# Patient Record
Sex: Male | Born: 1960 | Race: White | Hispanic: No | Marital: Married | Smoking: Former smoker
Health system: Southern US, Community
[De-identification: ages and names within clinical notes are randomized; demographics above are authoritative.]

## PROBLEM LIST (undated history)

## (undated) DIAGNOSIS — K449 Diaphragmatic hernia without obstruction or gangrene: Secondary | ICD-10-CM

## (undated) DIAGNOSIS — Z87442 Personal history of urinary calculi: Secondary | ICD-10-CM

## (undated) DIAGNOSIS — K2210 Ulcer of esophagus without bleeding: Secondary | ICD-10-CM

## (undated) DIAGNOSIS — K589 Irritable bowel syndrome without diarrhea: Secondary | ICD-10-CM

## (undated) DIAGNOSIS — M539 Dorsopathy, unspecified: Secondary | ICD-10-CM

## (undated) DIAGNOSIS — K5792 Diverticulitis of intestine, part unspecified, without perforation or abscess without bleeding: Secondary | ICD-10-CM

## (undated) DIAGNOSIS — Z9889 Other specified postprocedural states: Secondary | ICD-10-CM

## (undated) DIAGNOSIS — I10 Essential (primary) hypertension: Secondary | ICD-10-CM

## (undated) DIAGNOSIS — R112 Nausea with vomiting, unspecified: Secondary | ICD-10-CM

## (undated) DIAGNOSIS — K5790 Diverticulosis of intestine, part unspecified, without perforation or abscess without bleeding: Secondary | ICD-10-CM

## (undated) DIAGNOSIS — E785 Hyperlipidemia, unspecified: Secondary | ICD-10-CM

## (undated) DIAGNOSIS — K219 Gastro-esophageal reflux disease without esophagitis: Secondary | ICD-10-CM

## (undated) HISTORY — PX: ANKLE RECONSTRUCTION: SHX1151

## (undated) HISTORY — PX: BACK SURGERY: SHX140

## (undated) HISTORY — PX: TONSILLECTOMY: SUR1361

## (undated) HISTORY — DX: Diverticulitis of intestine, part unspecified, without perforation or abscess without bleeding: K57.92

## (undated) HISTORY — DX: Diverticulosis of intestine, part unspecified, without perforation or abscess without bleeding: K57.90

## (undated) HISTORY — DX: Hyperlipidemia, unspecified: E78.5

## (undated) HISTORY — DX: Ulcer of esophagus without bleeding: K22.10

## (undated) HISTORY — DX: Irritable bowel syndrome, unspecified: K58.9

## (undated) HISTORY — PX: NECK SURGERY: SHX720

## (undated) HISTORY — PX: HAND SURGERY: SHX662

## (undated) HISTORY — DX: Dorsopathy, unspecified: M53.9

## (undated) HISTORY — PX: COLON SURGERY: SHX602

## (undated) HISTORY — DX: Diaphragmatic hernia without obstruction or gangrene: K44.9

## (undated) HISTORY — DX: Gastro-esophageal reflux disease without esophagitis: K21.9

## (undated) HISTORY — PX: OTHER SURGICAL HISTORY: SHX169

## (undated) HISTORY — PX: APPENDECTOMY: SHX54

---

## 1998-07-18 ENCOUNTER — Encounter: Payer: Self-pay | Admitting: Emergency Medicine

## 1998-07-18 ENCOUNTER — Emergency Department (HOSPITAL_COMMUNITY): Admission: EM | Admit: 1998-07-18 | Discharge: 1998-07-18 | Payer: Self-pay | Admitting: Emergency Medicine

## 2002-10-04 ENCOUNTER — Encounter: Payer: Self-pay | Admitting: Emergency Medicine

## 2002-10-04 ENCOUNTER — Emergency Department (HOSPITAL_COMMUNITY): Admission: EM | Admit: 2002-10-04 | Discharge: 2002-10-04 | Payer: Self-pay | Admitting: Emergency Medicine

## 2002-10-11 ENCOUNTER — Inpatient Hospital Stay (HOSPITAL_COMMUNITY): Admission: EM | Admit: 2002-10-11 | Discharge: 2002-10-13 | Payer: Self-pay | Admitting: Emergency Medicine

## 2002-10-11 ENCOUNTER — Encounter: Payer: Self-pay | Admitting: Emergency Medicine

## 2002-11-09 ENCOUNTER — Encounter: Payer: Self-pay | Admitting: General Surgery

## 2002-11-14 ENCOUNTER — Inpatient Hospital Stay (HOSPITAL_COMMUNITY): Admission: RE | Admit: 2002-11-14 | Discharge: 2002-11-17 | Payer: Self-pay | Admitting: General Surgery

## 2002-11-14 ENCOUNTER — Encounter (INDEPENDENT_AMBULATORY_CARE_PROVIDER_SITE_OTHER): Payer: Self-pay

## 2002-11-17 ENCOUNTER — Encounter: Payer: Self-pay | Admitting: Gastroenterology

## 2004-10-15 ENCOUNTER — Ambulatory Visit: Payer: Self-pay | Admitting: Gastroenterology

## 2004-10-20 ENCOUNTER — Ambulatory Visit: Payer: Self-pay | Admitting: Gastroenterology

## 2004-11-25 ENCOUNTER — Ambulatory Visit: Payer: Self-pay | Admitting: Gastroenterology

## 2005-02-16 ENCOUNTER — Ambulatory Visit: Payer: Self-pay | Admitting: Family Medicine

## 2005-02-23 ENCOUNTER — Ambulatory Visit: Payer: Self-pay | Admitting: Family Medicine

## 2005-03-24 ENCOUNTER — Ambulatory Visit: Payer: Self-pay | Admitting: Family Medicine

## 2005-06-26 ENCOUNTER — Ambulatory Visit (HOSPITAL_COMMUNITY): Admission: RE | Admit: 2005-06-26 | Discharge: 2005-06-27 | Payer: Self-pay | Admitting: Neurosurgery

## 2005-09-14 ENCOUNTER — Encounter: Admission: RE | Admit: 2005-09-14 | Discharge: 2005-11-25 | Payer: Self-pay | Admitting: Neurosurgery

## 2006-01-17 ENCOUNTER — Emergency Department (HOSPITAL_COMMUNITY): Admission: EM | Admit: 2006-01-17 | Discharge: 2006-01-17 | Payer: Self-pay | Admitting: Emergency Medicine

## 2006-01-26 ENCOUNTER — Ambulatory Visit (HOSPITAL_BASED_OUTPATIENT_CLINIC_OR_DEPARTMENT_OTHER): Admission: RE | Admit: 2006-01-26 | Discharge: 2006-01-26 | Payer: Self-pay | Admitting: Urology

## 2006-01-27 ENCOUNTER — Emergency Department (HOSPITAL_COMMUNITY): Admission: EM | Admit: 2006-01-27 | Discharge: 2006-01-28 | Payer: Self-pay | Admitting: Emergency Medicine

## 2007-02-15 ENCOUNTER — Encounter: Payer: Self-pay | Admitting: Gastroenterology

## 2007-02-21 ENCOUNTER — Encounter: Admission: RE | Admit: 2007-02-21 | Discharge: 2007-02-21 | Payer: Self-pay | Admitting: General Surgery

## 2007-02-22 ENCOUNTER — Encounter: Payer: Self-pay | Admitting: Gastroenterology

## 2007-03-14 ENCOUNTER — Ambulatory Visit: Payer: Self-pay | Admitting: Gastroenterology

## 2007-09-15 DIAGNOSIS — K5730 Diverticulosis of large intestine without perforation or abscess without bleeding: Secondary | ICD-10-CM | POA: Insufficient documentation

## 2007-09-15 DIAGNOSIS — K219 Gastro-esophageal reflux disease without esophagitis: Secondary | ICD-10-CM | POA: Insufficient documentation

## 2007-09-15 DIAGNOSIS — K648 Other hemorrhoids: Secondary | ICD-10-CM | POA: Insufficient documentation

## 2008-02-07 ENCOUNTER — Ambulatory Visit (HOSPITAL_BASED_OUTPATIENT_CLINIC_OR_DEPARTMENT_OTHER): Admission: RE | Admit: 2008-02-07 | Discharge: 2008-02-07 | Payer: Self-pay | Admitting: Internal Medicine

## 2008-03-16 DIAGNOSIS — M5137 Other intervertebral disc degeneration, lumbosacral region: Secondary | ICD-10-CM | POA: Insufficient documentation

## 2008-03-16 DIAGNOSIS — E785 Hyperlipidemia, unspecified: Secondary | ICD-10-CM | POA: Insufficient documentation

## 2008-03-16 DIAGNOSIS — K279 Peptic ulcer, site unspecified, unspecified as acute or chronic, without hemorrhage or perforation: Secondary | ICD-10-CM | POA: Insufficient documentation

## 2008-03-20 ENCOUNTER — Ambulatory Visit: Payer: Self-pay | Admitting: Gastroenterology

## 2008-03-20 DIAGNOSIS — R1012 Left upper quadrant pain: Secondary | ICD-10-CM | POA: Insufficient documentation

## 2008-03-20 DIAGNOSIS — K921 Melena: Secondary | ICD-10-CM

## 2008-03-20 DIAGNOSIS — K589 Irritable bowel syndrome without diarrhea: Secondary | ICD-10-CM | POA: Insufficient documentation

## 2008-03-27 ENCOUNTER — Telehealth: Payer: Self-pay | Admitting: Gastroenterology

## 2008-03-28 ENCOUNTER — Ambulatory Visit: Payer: Self-pay | Admitting: Gastroenterology

## 2009-12-21 ENCOUNTER — Ambulatory Visit: Payer: Self-pay | Admitting: Diagnostic Radiology

## 2009-12-21 ENCOUNTER — Emergency Department (HOSPITAL_BASED_OUTPATIENT_CLINIC_OR_DEPARTMENT_OTHER): Admission: EM | Admit: 2009-12-21 | Discharge: 2009-12-21 | Payer: Self-pay | Admitting: Emergency Medicine

## 2010-03-14 ENCOUNTER — Encounter: Admission: RE | Admit: 2010-03-14 | Discharge: 2010-03-14 | Payer: Self-pay | Admitting: Neurosurgery

## 2010-06-11 ENCOUNTER — Inpatient Hospital Stay (HOSPITAL_COMMUNITY)
Admission: RE | Admit: 2010-06-11 | Discharge: 2010-06-17 | Payer: Self-pay | Source: Home / Self Care | Attending: Neurosurgery | Admitting: Neurosurgery

## 2010-06-17 ENCOUNTER — Emergency Department (HOSPITAL_BASED_OUTPATIENT_CLINIC_OR_DEPARTMENT_OTHER)
Admission: EM | Admit: 2010-06-17 | Discharge: 2010-06-17 | Payer: Self-pay | Source: Home / Self Care | Admitting: Emergency Medicine

## 2010-07-20 ENCOUNTER — Encounter: Payer: Self-pay | Admitting: General Surgery

## 2010-08-19 ENCOUNTER — Other Ambulatory Visit: Payer: Self-pay | Admitting: Neurosurgery

## 2010-08-19 DIAGNOSIS — M549 Dorsalgia, unspecified: Secondary | ICD-10-CM

## 2010-08-19 DIAGNOSIS — M5410 Radiculopathy, site unspecified: Secondary | ICD-10-CM

## 2010-08-20 ENCOUNTER — Other Ambulatory Visit: Payer: Self-pay | Admitting: Neurosurgery

## 2010-08-20 ENCOUNTER — Ambulatory Visit
Admission: RE | Admit: 2010-08-20 | Discharge: 2010-08-20 | Disposition: A | Discharge status: Home / Self Care | Payer: BC Managed Care – PPO | Source: Ambulatory Visit | Attending: Neurosurgery | Admitting: Neurosurgery

## 2010-08-20 DIAGNOSIS — M549 Dorsalgia, unspecified: Secondary | ICD-10-CM

## 2010-08-20 DIAGNOSIS — M5410 Radiculopathy, site unspecified: Secondary | ICD-10-CM

## 2010-09-03 ENCOUNTER — Ambulatory Visit
Admission: RE | Admit: 2010-09-03 | Discharge: 2010-09-03 | Disposition: A | Discharge status: Home / Self Care | Payer: BC Managed Care – PPO | Source: Ambulatory Visit | Attending: Neurosurgery | Admitting: Neurosurgery

## 2010-09-03 DIAGNOSIS — M549 Dorsalgia, unspecified: Secondary | ICD-10-CM

## 2010-09-03 DIAGNOSIS — M5410 Radiculopathy, site unspecified: Secondary | ICD-10-CM

## 2010-09-08 LAB — SURGICAL PCR SCREEN
MRSA, PCR: NEGATIVE
Staphylococcus aureus: NEGATIVE

## 2010-09-08 LAB — URINALYSIS, ROUTINE W REFLEX MICROSCOPIC
Hgb urine dipstick: NEGATIVE
Specific Gravity, Urine: 1.011 (ref 1.005–1.030)
Urobilinogen, UA: 0.2 mg/dL (ref 0.0–1.0)
pH: 7.5 (ref 5.0–8.0)

## 2010-09-08 LAB — CBC
Hemoglobin: 18.1 g/dL — ABNORMAL HIGH (ref 13.0–17.0)
MCH: 34 pg (ref 26.0–34.0)
RBC: 5.33 MIL/uL (ref 4.22–5.81)

## 2010-09-08 LAB — URINE CULTURE: Culture: NO GROWTH

## 2010-09-08 LAB — BASIC METABOLIC PANEL
CO2: 31 mEq/L (ref 19–32)
Calcium: 10.1 mg/dL (ref 8.4–10.5)
GFR calc Af Amer: >60 mL/min (ref >60)
GFR calc non Af Amer: >60 mL/min (ref >60)
Sodium: 138 mEq/L (ref 135–145)

## 2010-09-14 LAB — BASIC METABOLIC PANEL
GFR calc non Af Amer: >60 mL/min (ref >60)
Potassium: 3.8 mEq/L (ref 3.5–5.1)
Sodium: 141 mEq/L (ref 135–145)

## 2010-09-16 ENCOUNTER — Inpatient Hospital Stay (HOSPITAL_COMMUNITY)
Admission: RE | Admit: 2010-09-16 | Discharge: 2010-09-22 | DRG: 757 | Disposition: A | Discharge status: Home / Self Care | Payer: BC Managed Care – PPO | Source: Ambulatory Visit | Attending: Neurosurgery | Admitting: Neurosurgery

## 2010-09-16 ENCOUNTER — Ambulatory Visit (HOSPITAL_COMMUNITY): Payer: BC Managed Care – PPO

## 2010-09-16 DIAGNOSIS — Y839 Surgical procedure, unspecified as the cause of abnormal reaction of the patient, or of later complication, without mention of misadventure at the time of the procedure: Secondary | ICD-10-CM | POA: Diagnosis not present

## 2010-09-16 DIAGNOSIS — N9989 Other postprocedural complications and disorders of genitourinary system: Secondary | ICD-10-CM | POA: Diagnosis not present

## 2010-09-16 DIAGNOSIS — R339 Retention of urine, unspecified: Secondary | ICD-10-CM | POA: Diagnosis not present

## 2010-09-16 DIAGNOSIS — M5137 Other intervertebral disc degeneration, lumbosacral region: Principal | ICD-10-CM | POA: Diagnosis present

## 2010-09-16 DIAGNOSIS — I10 Essential (primary) hypertension: Secondary | ICD-10-CM | POA: Diagnosis present

## 2010-09-16 DIAGNOSIS — IMO0002 Reserved for concepts with insufficient information to code with codable children: Secondary | ICD-10-CM | POA: Diagnosis not present

## 2010-09-16 DIAGNOSIS — M51379 Other intervertebral disc degeneration, lumbosacral region without mention of lumbar back pain or lower extremity pain: Principal | ICD-10-CM | POA: Diagnosis present

## 2010-09-16 DIAGNOSIS — M5126 Other intervertebral disc displacement, lumbar region: Secondary | ICD-10-CM | POA: Diagnosis present

## 2010-09-16 LAB — BASIC METABOLIC PANEL
BUN: 13 mg/dL (ref 6–23)
Calcium: 9.4 mg/dL (ref 8.4–10.5)
GFR calc non Af Amer: >60 mL/min (ref >60)
Glucose, Bld: 93 mg/dL (ref 70–99)

## 2010-09-16 LAB — CBC
HCT: 45.1 % (ref 39.0–52.0)
MCH: 32.4 pg (ref 26.0–34.0)
MCHC: 35.3 g/dL (ref 30.0–36.0)
MCV: 91.9 fL (ref 78.0–100.0)
RDW: 13.7 % (ref 11.5–15.5)

## 2010-09-16 LAB — ABO/RH: ABO/RH(D): O POS

## 2010-09-17 LAB — POCT I-STAT 4, (NA,K, GLUC, HGB,HCT)
Glucose, Bld: 122 mg/dL — ABNORMAL HIGH (ref 70–99)
HCT: 37 % — ABNORMAL LOW (ref 39.0–52.0)
Hemoglobin: 10.9 g/dL — ABNORMAL LOW (ref 13.0–17.0)
Sodium: 138 mEq/L (ref 135–145)

## 2010-09-19 LAB — CROSSMATCH: Unit division: 0

## 2010-09-19 NOTE — Op Note (Signed)
NAME:  Kevin Banks, Kevin Banks            ACCOUNT NO.:  [id number]  MEDICAL RECORD NO.:  [id number]           PATIENT TYPE:  I  LOCATION:  3008                         FACILITY:  [facility]  PHYSICIAN:  Hilda Lias, M.D.   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  PROCEDURE:  Bilateral L4-L5 foraminotomy and diskectomy, decompression of the thecal sac, lysis of adhesions, right L2-3 intra and extraforaminal diskectomy, decompression of the L2-L3 nerve root. Microscope.  SURGEON:  Hilda Lias, MD  ASSISTANT:  Dr. Barnett Abu.  CLINICAL HISTORY:  [name] is a gentleman who underwent surgery in the past.  This was associated with CSF leak.  The patient came to my office complaining of pain going to the left leg.  He was treated with medication including epidural injection.  It did not help.  He came to see me in the office at this time not only was complaining of pain in the left leg, but burning sensation in the right side.  The MRI showed that he has a herniated disk at the level of L4-5 before he had mostly stenosis.  Also at the level of L2-3 in the right side, he has an extraforaminal herniated disk.  Surgery was advised.  I talked to his wife and also we talked about procedure that including further surgery, infection, CSF leak, all over again.  PROCEDURE IN DETAIL:  The patient was taken to the OR and after intubation, he was positioned in a prone manner.  The back was cleaned with DuraPrep.  Midline incision following the previous one was made. At the level of L4-5, we  opened bilaterally.  At the level of L2-3, we opened only the right side.  We brought the microscope into the area. At the level of L4-5 indeed there was no any anatomy that we can follow. We spent at least 45 minutes to do lysis of adhesions.  At the end in the right side, we were able to see the thecal sac once we removed more lamina of L4 and L5.  The patient had quite a bit of adhesion, lysis was accomplished.  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  Then, we retracted the L5 nerve root and we entered disk space.  Total diskectomy from the right side was done.  The same procedure was done on the left side with the same finding of lysis of adhesion.  Diskectomy was accomplished bilaterally with plenty of foraminotomy with room for the L4 and L5 nerve root.  Then, we went at the level of L2-3 in the right side.  We drilled the lower lamina of L2 and L3.  The patient also had quite a bit of adhesion and lysis was accomplished.  We entered the disk space and then we found that most of the disk was mostly  extraforaminal. We did a foraminotomy.  We went outside.  We drilled the superior lateral aspect of the facet of L2-3.  A transverse ligament was also excised.  Immediately what we found that the L2 nerve root was bowing posteriorly.  There was a large herniated disk which was displacing L2 nerve root.  Incision was made and large amount of degenerative disk was removed.  The nerve itself #2 was kind of pale.  At the end, we had plenty of room for the L2 nerve root.  Then, at the level of L4-5, we used only Duragen followed by Tisseel.  The area was covered.  Then, all the area was irrigated and the wound was closed with Vicryl and staples.  The patient is going remain flat in bed for at least 72 hours.          ______________________________ Hilda Lias, M.D.     EB/MEDQ  D:  09/16/2010  T:  09/17/2010  Job:  [id number]  Electronically Signed by Hilda Lias M.D. on 09/19/2010  02:38:21 PM

## 2010-09-19 NOTE — H&P (Signed)
NAMECAYMEN, Banks            ACCOUNT NO.:  [id number]  MEDICAL RECORD NO.:  [id number]           PATIENT TYPE:  I  LOCATION:  3008                         FACILITY:  [facility]  PHYSICIAN:  Hilda Lias, M.D.   DATE OF BIRTH:  09-Jul-1960  DATE OF ADMISSION:  09/16/2010 DATE OF DISCHARGE:                             HISTORY & PHYSICAL   CLINICAL HISTORY:  [name] is a gentleman who in the past underwent L4-L5 foraminotomy as well as foraminotomy at the level of 2-3 in the right side and 3-4 in the right.  The patient did well, but lately he had been complaining of pain going to the left foot.  He had conservative treatment with the medication.  Also, he has a history of muscle spasm mostly at nighttime.  We did an MRI which showed that he has a herniated disk at the level of L4-L5 bilaterally.  I saw him yesterday in my office and now he has pain down into left side going to the left foot, but now he has had burning pain and numbness going to the right side up to the knee but not below the knee.  Because of no improvement in conservative treatment including epidural injection, he is being taken for surgery.  PAST MEDICAL HISTORY:  Lumbar diskectomy at the L5-1 in 1993 after cervical diskectomy and foraminotomy at the level of 2-3 on the right side, 3-4 on the left and 4-5.  He also had anterior cervical fusion at the level of cervical 5-6.  He is not allergic to any medication.  SOCIAL HISTORY:  He drinks socially.  He only smoked two cigarettes a day.  FAMILY HISTORY:  Positive for high blood pressure, diabetes.  REVIEW OF SYSTEMS:  Back pain, right thigh pain with left foot pain.  MEDICATIONS:  He is taking Kapidex, Flomax, Cipro, oxycodone, and Diovan.  PHYSICAL EXAMINATION:  GENERAL:  The patient came to my office limping from the left leg.  When he sat immediately, he was __________ the right side. HEAD, EARS, NOSE, AND THROAT:  Normal. NECK:  He has a scar  anteriorly.  He is a poor flexibility. LUNGS:  Clear. HEART:  Sounds normal. ABDOMEN:  Normal. EXTREMITIES:  Normal pulses. NEUROLOGIC:  Mental status normal.  Cranial nerves normal.  Strength, he has 4/5 dorsiflexion of the right foot.  Reflexes symmetrical. Sensation is normal, although he complained of burning sensation in the right side anteriorly.  Straight leg raising is positive 80 degrees. The MRI showed that he has herniated disk at the level of L4-L5 bilaterally with some epidural fluid and also the level of 2-3 to the right side.  He has herniated disk laterally affecting the L2 nerve root.  CLINICAL IMPRESSION:  L4-5 herniated disk bilaterally, 2-3 right side herniated disk.  RECOMMENDATIONS:  The patient came in for surgery.  Procedure will be diskectomy 2-3 and 4-5 bilaterally.  He knows the risks of infection, CSF leak, worsening pain, paralysis, need for surgery which might require lumbar fusion.          ______________________________ Hilda Lias, M.D.     EB/MEDQ  D:  09/16/2010  T:  09/17/2010  Job:  [id number]  Electronically Signed by Hilda Lias M.D. on 09/19/2010 02:38:19 PM

## 2010-09-25 NOTE — Discharge Summary (Signed)
[name], [name]            ACCOUNT NO.:  [id number]  MEDICAL RECORD NO.:  [id number]           PATIENT TYPE:  I  LOCATION:  3008                         FACILITY:  [facility]  PHYSICIAN:  Hilda Lias, M.D.   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  PROCEDURE:  Bilateral L4-5 diskectomy, lysis of adhesions, foraminotomy, right L2-L3 extraforaminal diskectomy, durotomy.  Microscope.  SURGEON:  Hilda Lias, M.D.  ASSISTANT:  Stefani Dama, M.D.  CLINICAL HISTORY:  [name] is a gentleman who in the past underwent foraminotomy and diskectomy in the lumbar area.  The patient did well but lately, he had been complaining of pain down to the left leg all the way down to the foot.  The patient has x-ray which shows severe stenosis with a central disk at the level of 4-5.  Prior to this when he had surgery, he developed superficial infection and CSF leak.  The patient was given some conservative treatment.  He did well for awhile but now he came back with his wife not only complaining of pain on the left leg but also pain on the right side.  The right 2-3 showed a large extraforaminal herniated disk.  Surgery was advised.  The risks of course were possibility of no improvement whatsoever, CSF leak, infection.  LABORATORY:  Within normal limits.  COURSE IN THE HOSPITAL:  The patient was taken to surgery and difficult L4-5 foraminotomy and diskectomy was achieved.  The patient had quite a bit of scar tissue.  It took [place] quite a bit more than normal to not only to decompress the L4-L5, but  also the L2-L3 on the right side.  At those layer, there was small opening mostly in the area where he has the scar tissue, it was difficult to put any stitch.  We used a DuraGen followed by Tisseel.  The patient was kept flat in bed for 96-hour and since 2 days ago, he had been getting out of bed.  The only problem right now is again that he had same problem of urinary retention that he had before when he had the previous surgery.  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 Bowels are working really nicely.  He is walking with minimal discomfort.  He is going to be discharged today with a Foley and catheter in place to be seen by me in 10 days and also set up an appointment with the urologist.  MEDICATION:  He will be taking diazepam, Roxicodone, and Flomax.  DIET:  Regular.  ACTIVITY:  Not to drive, not to do any heavy lifting.  FOLLOWUP:  To be seen by me in 10 days and follow with a urologist in [place], [place].          ______________________________ Hilda Lias, M.D.  EB/MEDQ  D:  09/22/2010  T:  09/23/2010  Job:  [id number]  Electronically Signed by Hilda Lias M.D. on 09/25/2010 12:56:42 PM

## 2010-11-11 NOTE — Assessment & Plan Note (Signed)
[facility]                         GASTROENTEROLOGY OFFICE NOTE   NAME:Kevin Banks, Kevin Banks                    MRN:          [id number]  DATE:03/14/2007                            DOB:          1960/10/24    Kevin Banks relates problems with left back pain for the past two  months.  He has been evaluated by [place] Service, M.D. with no  evidence of active kidney stone problems.  A CT scan of the abdomen and  pelvis was performed on February 21, 2007, that showed a few scattered  colonic diverticula, prominent seminal vesicles and mild distention of  both distal ureters.  All of these findings were stable compared to  prior imaging studies.  He has had worsening problems with constipation  and relates 3-4 bowel movements a day with small pellet-like stools, and  incomplete evacuation.  He has had recurrent problems with small volume  rectal bleeding.  He was seen by Sharlet Salina T. Hoxworth, M.D. on February 15, 2007, and his office note is enclosed in the chart.  He did have  moderate size internal hemorrhoids noted on anoscopy.  The patient  previously underwent colonoscopy in April of 2006 which showed  diverticulosis, widely patent anastomosis from a prior sigmoid  colectomy, and no other abnormalities.  He had a change in bowel habits,  abdominal pain, and bloating at that time.  He was advised to remain on  Citrucel longterm, but has not done so.  He has recently restarted  Citrucel with some improvement in his bowel habits.  He was given a  prescription of suppository by Dr. Johna Sheriff and his hemorrhoid symptoms  have improved, although he is still having intermittent small volume  rectal bleeding.  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 Jeral Fruit.  He takes about a half of hydrocodone a week and denies  any heavier usage.   CURRENT MEDICATIONS:  Listed on the chart, updated and reviewed.   ALLERGIES:  No known drug  allergies.   PHYSICAL EXAMINATION:  GENERAL:  Well-developed, well-nourished, in no  acute distress.  VITAL SIGNS:  Weight 193 pounds, blood pressure 126/76, pulse 88 and  regular.  HEENT:  Anicteric sclerae, oropharynx clear.  CHEST:  Clear to auscultation bilaterally.  BACK:  Without CVA or spinal tenderness.  ABDOMEN:  Soft, nontender, nondistended.  Normal active bowel sounds.  No palpable organomegaly, masses, or hernias.  RECTAL:  Deferred to time of colonoscopy, recent examination by Dr.  Johna Sheriff showed no abnormalities on digital examination and moderate  hemorrhoids on anoscopy.  NEUROLOGY:  Alert and oriented x3.  Nonfocal.   ASSESSMENT:  Change in bowel habits with worsening constipation as well  as small volume hematochezia.  I suspect he has hemorrhoidal bleeding.  Rule out colorectal neoplasms and other disorders.  He is advised to  maintain a high fiber diet and use Citrucel one to three times a day  with the dose titrated for adequate relief of constipation.  I do not  feel his left back is related to a gastrointestinal disorder and I have  asked him to consider follow up with Dr. Jeral Fruit or his primary physician  for further evaluation.  Risks, benefits, and alternatives to  colonoscopy with  possible biopsy, possible polypectomy, and possible destruction of  internal hemorrhoids discussed with the patient and he consents to  proceed.  This will be scheduled electively.     Venita Lick. Russella Dar, MD, [facility]  Electronically Signed    MTS/MedQ  DD: 03/14/2007  DT: 03/14/2007  Job #: [id number]   cc:   Lorne Skeens. Hoxworth, M.D.

## 2010-11-14 NOTE — Op Note (Signed)
NAME:  Kevin Banks, Kevin Banks                      ACCOUNT NO.:  [id number]   MEDICAL RECORD NO.:  [id number]                   PATIENT TYPE:  INP   LOCATION:  0355                                 FAC[facility]   PHYSICIAN:  Sharlet Salina T. Hoxworth, M.D.          DATE OF BIRTH:  04/05/1961   DATE OF PROCEDURE:  11/14/2002  DATE OF DISCHARGE:                                 OPERATIVE REPORT   PREOPERATIVE DIAGNOSES:  1. Sigmoid colon diverticulitis.  2. Subumbilical hernia.   POSTOPERATIVE DIAGNOSES:  1. Sigmoid colon diverticulitis.  2. Subumbilical hernia.   OPERATION/PROCEDURE:  1. Sigmoid colectomy.  2. Repair of subumbilical hernia.  3. Incidental appendectomy.   SURGEON:  Lorne Skeens. Hoxworth, M.D.   ASSISTANT:  Abigail Miyamoto, M.D.   BRIEF HISTORY:  Kevin Banks is a 50 year old white male who recently  presented with acute diverticulitis confirmed by CT scan.  He was initially  treated as an outpatient but worsened and was admitted for IV antibiotics.  At that time had a palpable mass in the mid sigmoid colon.  He clinically  improved and was discharged, but then required another course of outpatient  antibiotics for recurrent symptoms and has had persistent mild discomfort  and persistent palpable mass in the area of the mid sigmoid colon.  With  these findings at his young age, we discussed options for continued medical  management versus surgery and have elected to proceed with resection of this  area.  The nature of the procedure, indications, risks of bleeding and  infection were discussed and understood preoperatively.  He also has a  symptomatic supraumbilical hernia about 2 cm in diameter which we will plan  to repair at the same time.   DESCRIPTION OF PROCEDURE:  Following mechanical antibiotic bowel prep at  home, the patient was brought to operating room, placed in the supine  position on the operating table and general endotracheal anesthesia was  induced.  ________ were placed.  He received IV antibiotics.  The abdomen  was sterilely prepped and draped.  A low midline incision was used and dissection carried down through the  subcutaneous tissue and midline fascia and the peritoneum entered through  the incision.  Exploration revealed a very discrete inflammatory mass at the  apex of the sigmoid colon.  There was some mild thickening for a few  centimeters in either direction but the remainder of the colon was normal.  There was a palpable hernia just about 1 cm in diameter just above the  umbilicus.  No other abnormalities.  The left and sigmoid colon were  mobilized dividing the peritoneum along the line of Toldt.  The ureter was  identified and protected.  Plan areas for resection of the proximal distal  sigmoid colon were chosen.  These areas were cleaned of mesentery and pericolic fat and divided between  the [facility] and Kocher clamps.  The mesentery and the wall segment was then  sequentially divided between clamps.  Specimen removed and mesentery tied  with 2-0 silk.  Following this an end-to-end anastomosis was created with  interrupted full-thickness 2-0 silk sutures. This was under no tension with  the blood supply appeared widely patent.  Following this, gloves and instruments were changed.  The abdomen was  irrigated.  I elected to repair the epigastric hernia intra-abdominally.  The defect was cleared somewhat of preperitoneal fat  and then several  interrupted 0 Surgilon sutures were used from the intraperitoneal approach  to repair the fascial defect.  We also performed an incidental appendectomy.  The mesoappendix was divided  between clamps, tied with 2-0 silk and the appendix clamped at its base,  tied with 2-0 chromic, divided and removed and the stump inverted with a  pursestring of 3-0 silk.  The viscera was returned to its anatomical  position.  Abdomen was inspected for hemostasis which appeared complete.  The  midline wound was closed with running #1 PDS __________ incision and  tied centrally.  Subcutaneous tissue was irrigated and the skin closed with  staples.  Sponge and needle counts were correct.  Pressure dressings were  applied and the patient taken to recovery in good condition.                                                Lorne Skeens. Hoxworth, M.D.    Tory Emerald  D:  11/14/2002  T:  11/15/2002  Job:  [id number]

## 2010-11-14 NOTE — Op Note (Signed)
NAMEHERRON, Kevin Banks             ACCOUNT NO.:  [id number]   MEDICAL RECORD NO.:  [id number]          PATIENT TYPE:  AMB   LOCATION:  NESC                         FACILITY:  [facility]   PHYSICIAN:  [place] Service, M.D.DATE OF BIRTH:  04/14/1961   DATE OF PROCEDURE:  01/26/2006  DATE OF DISCHARGE:                                 OPERATIVE REPORT   PREOPERATIVE DIAGNOSIS:  Recent CT scan indicated 3-mm stone at the right  distal ureter and an 8-mm stone and the left distal ureter.  There is some  clinical suspicion that the 3-mm right UVJ stone has passed; KUB, however  shows persistence of an 8-mm calcification in the expected location of the  left distal ureter.  Plans were made for cystoscopy, retrogrades and  ureteroscopy.   POSTOPERATIVE DIAGNOSIS:  Recent CT scan indicated 3-mm stone at the right  distal ureter and an 8-mm stone and the left distal ureter.  There is some  clinical suspicion that the 3-mm right UVJ stone has passed; KUB, however  shows persistence of an 8-mm calcification in the expected location of the  left distal ureter.  Plans were made for cystoscopy, retrogrades and  ureteroscopy.   PROCEDURE:  1.  Cystoscopy.  2.  Retrogrades.  3.  Ureteroscopy with stone manipulation.   ATTENDING:  [place] Service, M.D.   ANESTHESIA:  General.   DRAINS:  None.   COMPLICATIONS:  None obvious.   DESCRIPTION OF PROCEDURE:  The patient was prepped and draped in the dorsal  lithotomy position after institution of an adequate level of general  anesthesia.  A well-lubricated 21-French panendoscope was gently inserted at  the urethral meatus, normal urethra and sphincter, nonobstructive prostate.  Orifices were slit-like, but well away from the prostatic urethra.  A cone-  tip catheter was selected; right retrograde was performed and showed mild  physiologic narrowing of the right distal ureter, but no evidence of filling  defect on the right side.  End-hole catheter  was then selected.  Contrast  was reinjected on the right and again, no filling defect was observed and  there was prompt efflux of contrast from the right ureteral orifice over a  period of about 3-5 minutes.  A similar technique was used on the left side  with a cone-tip catheter, identified physiologically narrow distal ureter  with a fairly wide proximal ureter and an 8 x 5-mm filling defect consistent  with the stone noted at the time of CT scan, January 17, 2006.  There was no  evidence of proximal filling defects and the patient had surprisingly  minimal hydronephrosis.  A guidewire was negotiated beyond the stone.  Cystoscope was removed.  A short 6-French ureteroscope was inserted  alongside the guidewire.  A second guidewire was used to achieve additional  dilation of the distal ureter.  Ureteroscope was passed above the stone to  the limits of the short 6-French scope and then gently withdrawn.  It was  felt to the distal ureter had been sufficiently dilated by passage of the  ureteroscope to allow gentle extraction of the 8-mm calculus.  It was  negotiated into a Bard basket and then gently withdrawn and recovered  intact.  Ureteroscope was then reinserted.  There was mild irritation of  distal ureter.  Guidewire was in place.  Ureteroscope  passed easily along the guidewire to a point just below the UPJ.  Ureteroscope was carefully withdrawn, no retained stony fragments or  ureteral injury identified.  Ureteroscope was then withdrawn, as was the  guidewire, bladder was drained, cystoscope was removed.  The patient was  returned to Recovery in satisfactory condition.           ______________________________  [place] Service, M.D.     RH/MEDQ  D:  01/26/2006  T:  01/26/2006  Job:  [id number]   cc:   Loreen Freud, M.D.  [address]  [place] [address]

## 2010-11-14 NOTE — Discharge Summary (Signed)
NAME:  Kevin Banks, Kevin Banks                      ACCOUNT NO.:  [id number]   MEDICAL RECORD NO.:  [id number]                   PATIENT TYPE:  INP   LOCATION:  0355                                 FACILITY:  [facility]   PHYSICIAN:  Sharlet Salina T. Hoxworth, M.D.          DATE OF BIRTH:  Apr 18, 1961   DATE OF ADMISSION:  11/14/2002  DATE OF DISCHARGE:  11/17/2002                                 DISCHARGE SUMMARY   DISCHARGE DIAGNOSES:  Sigmoid diverticulitis with pericolonic abscess.   OPERATIONS AND PROCEDURES:  Sigmoid colectomy 11/14/2002.   HISTORY OF PRESENT ILLNESS:  [name] is a 50 year old white male who  in early April developed acute left lower quadrant pain, and CT scan  obtained by Dr. Janey Greaser at that time revealed apparently uncomplicated  sigmoid diverticulitis.  Initially improved on outpatient antibiotics, but  about a week into his illness developed worsening pain, and I admitted to  [facility]. CT scan at that time again showed significant evidence  of sigmoid diverticulitis with inflammatory change and thickening but no  complications such as free air or abscess.  He did have a palpable left  lower quadrant mass at that time. He improved after several days of IV  antibiotics and was discharged on oral antibiotics.   Since being home, he has felt gradually better but still has some mild  ongoing left lower quadrant pain, some nausea, and some occasional diarrhea.  He has continued to have a slightly tender palpable mass.  He had one  further course of antibiotics due to some worsening of his pain after the  initial course.  After followup in the office in three or four weeks with  persistent symptoms and palpable mass, particularly his young age, we  elected to proceed with elective resection of his sigmoid colon.  He is  admitted for this procedure following mechanical and antibiotic bowel prep  at home.   PAST MEDICAL HISTORY:  He had endoscopy and  colonoscopy by Dr. Russella Dar in  1995.  He had ankle surgery and back surgery.   MEDICATIONS:  Only oral antibiotics as above.   ALLERGIES:  PERCOCET.   For Social History, Family History, and Review of Systems, please see  admission H&P.   PERTINENT PHYSICAL EXAMINATION:  GENERAL:  He is a healthy-appearing white  male.  ABDOMEN:  Mild left lower quadrant tenderness and a palpable mass in the  left lower quadrant.   HOSPITAL COURSE:  The patient was admitted on the morning of the procedure  and underwent uneventful sigmoid colectomy.  He had a discrete area of  severe inflammation in the mid sigmoid colon.  The remainder of the  abdominal exploration was negative.  His postoperative course was extremely  smooth.  He had minimal pain and no nausea.   He was started on clear liquid diet on the first postoperative day which he  has tolerated well and was able to be advanced rapidly  up to a regular diet.  By the third postoperative day, he was tolerating a regular diet, passing  flatus.  Abdomen was soft and nontender.  Wound healing primarily.   PATHOLOGY:  Confirmed sigmoid diverticulitis with pericolonic abscess.   DISCHARGE MEDICATIONS:  Mepergan or Tylenol as needed.   ACTIVITY:  Limitations were discussed.   FOLLOW UP:  In my office in one week for staple removal.                                               Sharlet Salina T. Hoxworth, M.D.    Tory Emerald  D:  12/06/2002  T:  12/06/2002  Job:  [id number]   cc:   Dr. Janey Greaser

## 2010-11-14 NOTE — Op Note (Signed)
Kevin Banks, FAWAZ             ACCOUNT NO.:  [id number]   MEDICAL RECORD NO.:  [id number]          PATIENT TYPE:  AMB   LOCATION:  SDS                          FACILITY:  [facility]   PHYSICIAN:  Hilda Lias, M.D.   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   POSTOPERATIVE DIAGNOSES:  C5-C6 and C6-C7 spondylosis with recurrent  radiculopathy.   PROCEDURE:  Anterior C5-6, C6-7 diskectomy, decompression of the spinal  cord, bilateral foraminotomy, inter-body fusion with allograft and  autograft.  Plate from C5 to C7, microscope.   SURGEON:  Hilda Lias, M.D.   ASSISTANT:  Clydene Fake, M.D.   INDICATIONS FOR PROCEDURE:  The patient has been followed by me for many  years because of neck and right upper extremity pain.  Complains that the  pain is getting worse.  X-rays showed worsening of the spondylosis between  C5-6 and C6-7.  The patient wants to proceed with surgery.  The risks were  explained in the history and physical.   DESCRIPTION OF PROCEDURE:  The patient was taken to the operating room and  after intubation the neck was prepped with Betadine.  A transverse incision  was made through the skin and platysma down to the cervical spine.  X-rays  showed that indeed we were at the level of C5-6.  From then on with the  microscope, we removed the anterior osteophyte and we opened the anterior  ligament.  We used a curet to do a total diskectomy.  Decompression of the  spinal cord was achieved after we removed the posterior ligament.  The  patient has quite a bit of overgrowth at the joint, mostly going to the  right side, producing foraminal narrowing.  Decompression of both C5-C6  nerve roots was made.  The same procedure was done at the level of the C6  and C7 where we found the same  spondylosis with compromise of the nerve  root.  At the level of C5-6 to the right we found two fragments compromising  the C6 nerve root.  At the end, we have a good decompression of the nerve  root, as well as the spinal cord.  The end plate was treated; however, we  tried to use tricortical bone of 7 mm height.  When we were to insert it, we  found that they were not really tricortical but only bicortical.  Because of  that, there was no __________.  I went ahead and did a different type of  bone graft with a hole in the middle of 8 mm height with lordotic shape, and  we used autograft inside the cavity.  It was inserted.  Then we used a plate  from C5 to C7 using five screws.  A lateral C-spine showed good position of  the bone graft and the plate.  From then on the area was irrigated.  We  identified the esophagus which was normal.  There  was not any bleeding after  five minutes.  From then on the area was irrigated and closed with Vicryl  and Steri-Strips.           ______________________________  Hilda Lias, M.D.    EB/MEDQ  D:  06/26/2005  T:  06/26/2005  Job:  [id number]

## 2011-04-07 ENCOUNTER — Other Ambulatory Visit: Payer: Self-pay | Admitting: Neurosurgery

## 2011-04-07 DIAGNOSIS — M5410 Radiculopathy, site unspecified: Secondary | ICD-10-CM

## 2011-04-07 DIAGNOSIS — M549 Dorsalgia, unspecified: Secondary | ICD-10-CM

## 2011-04-10 ENCOUNTER — Ambulatory Visit
Admission: RE | Admit: 2011-04-10 | Discharge: 2011-04-10 | Disposition: A | Discharge status: Home / Self Care | Payer: BC Managed Care – PPO | Source: Ambulatory Visit | Attending: Neurosurgery | Admitting: Neurosurgery

## 2011-04-10 DIAGNOSIS — M549 Dorsalgia, unspecified: Secondary | ICD-10-CM

## 2011-04-10 DIAGNOSIS — M5410 Radiculopathy, site unspecified: Secondary | ICD-10-CM

## 2011-04-10 MED ORDER — OXYCODONE-ACETAMINOPHEN 5-325 MG PO TABS
2.0000 | ORAL_TABLET | Freq: Once | ORAL | Status: AC
  Administered 2011-04-10: 2 via ORAL

## 2011-04-10 MED ORDER — DIAZEPAM 5 MG PO TABS
10.0000 mg | ORAL_TABLET | Freq: Once | ORAL | Status: AC
  Administered 2011-04-10: 10 mg via ORAL

## 2011-04-10 MED ORDER — IOHEXOL 180 MG/ML  SOLN
15.0000 mL | Freq: Once | INTRAMUSCULAR | Status: AC | PRN
  Administered 2011-04-10: 15 mL via INTRATHECAL

## 2011-04-10 MED ORDER — ONDANSETRON HCL 4 MG/2ML IJ SOLN: 4.0000 mg | Freq: Four times a day (QID) | INTRAMUSCULAR | Status: DC | PRN

## 2011-04-10 NOTE — Progress Notes (Signed)
Wife at bedside.  Patient medicated for pain, though otherwise "comfortable."  jkl

## 2011-06-19 ENCOUNTER — Ambulatory Visit: Payer: Self-pay | Admitting: Pulmonary Disease

## 2011-11-09 ENCOUNTER — Encounter: Payer: Self-pay | Admitting: Gastroenterology

## 2011-11-24 ENCOUNTER — Encounter: Payer: Self-pay | Admitting: Gastroenterology

## 2011-11-24 ENCOUNTER — Ambulatory Visit (INDEPENDENT_AMBULATORY_CARE_PROVIDER_SITE_OTHER): Payer: BC Managed Care – PPO | Admitting: Gastroenterology

## 2011-11-24 VITALS — BP 130/80 | HR 88 | Ht 72.0 in | Wt 187.0 lb

## 2011-11-24 DIAGNOSIS — K219 Gastro-esophageal reflux disease without esophagitis: Secondary | ICD-10-CM

## 2011-11-24 MED ORDER — DEXLANSOPRAZOLE 60 MG PO CPDR: 60.0000 mg | DELAYED_RELEASE_CAPSULE | Freq: Every morning | ORAL | Status: DC

## 2011-11-24 NOTE — Patient Instructions (Addendum)
Take your Dexilant once daily 30 minutes before breakfast or lunch. Also take Pepcid AC one tablet by mouth daily at bedtime. cc: Derek Jack, MD

## 2011-11-24 NOTE — Progress Notes (Signed)
History of Present Illness: This is a 51 year old male with reflux symptoms. He has a history of GERD and previously underwent upper endoscopy in September 2009 showing small hiatal hernia. He has reflux symptoms about 3 times per week occasionally occur at night time. He takes his before his evening meal. Denies weight loss, abdominal pain, constipation, diarrhea, change in stool caliber, melena, hematochezia, nausea, vomiting, dysphagia, chest pain.  Current Medications, Allergies, Past Medical History, Past Surgical History, Family History and Social History were reviewed in [employer] record.  Physical Exam: General: Well developed , well nourished, no acute distress Head: Normocephalic and atraumatic Eyes:  sclerae anicteric, EOMI Ears: Normal auditory acuity Mouth: No deformity or lesions Lungs: Clear throughout to auscultation Heart: Regular rate and rhythm; no murmurs, rubs or bruits Abdomen: Soft, non tender and non distended. No masses, hepatosplenomegaly or hernias noted. Normal Bowel sounds Musculoskeletal: Symmetrical with no gross deformities  Pulses:  Normal pulses noted Extremities: No clubbing, cyanosis, edema or deformities noted Neurological: Alert oriented x 4, grossly nonfocal Psychological:  Alert and cooperative. Normal mood and affect  Assessment and Recommendations:  1. GERD. Standard antireflux measures including 4 inch bed blocks. Change timing of Dexilant to before breakfast or before lunch. May use Pepcid AC 1-2 at bedtime as needed. Return office visit for 6 weeks.

## 2011-12-18 ENCOUNTER — Other Ambulatory Visit: Payer: Self-pay | Admitting: Anesthesiology

## 2011-12-18 DIAGNOSIS — IMO0002 Reserved for concepts with insufficient information to code with codable children: Secondary | ICD-10-CM

## 2011-12-21 ENCOUNTER — Other Ambulatory Visit: Payer: Self-pay | Admitting: Anesthesiology

## 2011-12-21 DIAGNOSIS — Z139 Encounter for screening, unspecified: Secondary | ICD-10-CM

## 2011-12-23 ENCOUNTER — Ambulatory Visit
Admission: RE | Admit: 2011-12-23 | Discharge: 2011-12-23 | Disposition: A | Discharge status: Home / Self Care | Payer: BC Managed Care – PPO | Source: Ambulatory Visit | Attending: Anesthesiology | Admitting: Anesthesiology

## 2011-12-23 DIAGNOSIS — IMO0002 Reserved for concepts with insufficient information to code with codable children: Secondary | ICD-10-CM

## 2011-12-23 DIAGNOSIS — Z139 Encounter for screening, unspecified: Secondary | ICD-10-CM

## 2012-01-11 ENCOUNTER — Ambulatory Visit: Payer: Self-pay | Admitting: Gastroenterology

## 2012-12-08 ENCOUNTER — Encounter (INDEPENDENT_AMBULATORY_CARE_PROVIDER_SITE_OTHER): Payer: Self-pay | Admitting: General Surgery

## 2012-12-08 ENCOUNTER — Ambulatory Visit (INDEPENDENT_AMBULATORY_CARE_PROVIDER_SITE_OTHER): Payer: BC Managed Care – PPO | Admitting: General Surgery

## 2012-12-08 VITALS — BP 140/100 | HR 64 | Temp 98.6°F | Resp 15 | Ht 72.0 in | Wt 194.0 lb

## 2012-12-08 DIAGNOSIS — K432 Incisional hernia without obstruction or gangrene: Secondary | ICD-10-CM

## 2012-12-08 NOTE — Patient Instructions (Signed)
Call us when you are ready to schedule surgery

## 2012-12-08 NOTE — Progress Notes (Signed)
Subjective:   painful hernia  Patient ID: NOTNAMED SCHOLZ, male   DOB: 1961-06-23, 52 y.o.   MRN: [id number]  HPI  Patient is a 52 year old male well known to me status post sigmoid colectomy for diverticulitis with abscess in 2004. He states at that time that he had a small umbilical hernia that I primarily repaired in closure of his incision. In the last couple of years he has developed an uncomfortable bulge just above his umbilicus. It is occasionally painful. The patient has chronic GI symptoms including gas and bloating as well as significant chronic reflux that is moderately well controlled with proton pump inhibitors and elevating the head of his bed. He has local symptoms at the hernia and was also wondering if intestinal symptoms may be related to this. He feels is getting larger over time.  Past Medical History  Diagnosis Date  . Hiatal hernia   . GERD (gastroesophageal reflux disease)   . Diverticulosis   . Hemorrhoids   . Diverticulitis   . Erosive esophagitis   . IBS (irritable bowel syndrome)   . Spinal disorder   . Hyperlipidemia    Past Surgical History  Procedure Laterality Date  . Back surgery    . Neck surgery    . Hand surgery    . Ankle reconstruction    . Tonsillectomy    . Appendectomy    . Lower back      dec 2011, feb 2012  . Colon surgery     Current Outpatient Prescriptions  Medication Sig Dispense Refill  . DiphenhydrAMINE HCl (BENADRYL ALLERGY PO) Take 1 tablet by mouth daily.      Marland Kitchen esomeprazole (NEXIUM) 40 MG capsule Take 40 mg by mouth daily before breakfast.      . oxycodone (ROXICODONE) 30 MG immediate release tablet Take 30 mg by mouth every 4 (four) hours as needed.       No current facility-administered medications for this visit.   Allergies  Allergen Reactions  . Morphine And Related Itching   History  Substance Use Topics  . Smoking status: Current Every Day Smoker -- 0.25 packs/day  . Smokeless tobacco: Never Used  . Alcohol  Use: Yes     Comment: rarely     Review of Systems  Constitutional: Negative.   Respiratory: Negative.   Cardiovascular: Negative.   Gastrointestinal: Positive for nausea, abdominal pain and abdominal distention. Negative for vomiting.       GERD and reflux  Musculoskeletal: Positive for back pain.       Objective:   Physical Exam BP 140/100  Pulse 64  Temp(Src) 98.6 F (37 C) (Temporal)  Resp 15  Ht 6' (1.829 m)  Wt 194 lb (87.998 kg)  BMI 26.31 kg/m2 General: Alert, well-developed Caucasian male, in no distress Skin: Warm and dry without rash or infection. HEENT: No palpable masses or thyromegaly. Sclera nonicteric. Pupils equal round and reactive. Oropharynx clear. Lymph nodes: No cervical, supraclavicular, or inguinal nodes palpable. Lungs: Breath sounds clear and equal without increased work of breathing Cardiovascular: Regular rate and rhythm without murmur. No JVD or edema. Peripheral pulses intact. Abdomen: Nondistended. Soft and nontender. No masses palpable. No organomegaly. Well-healed low midline incision up to the umbilicus. Just above the umbilicus is a small but definite midline hernia which appears to pop through a fairly small defect on palpation. It is tender. Extremities: No edema or joint swelling or deformity. No chronic venous stasis changes. Neurologic: Alert and fully oriented. Gait normal.  Assessment:     Symptomatic ventral/incisional hernia. This is giving local pain and symptoms. He also has chronic GI complaints including significant reflux. I told him I did not think that repairing his hernia would have any positive effect on these. We discussed that surgical correction is available for his significant reflux. His father had surgery for reflux many years ago with some unpleasant side effects and he is not interested in having surgical treatment for his reflux. He would very much like to have a symptomatic ventral incisional hernia repaired. We  discussed laparoscopic repair and indications as well as risks of anesthetic complications, visceral injury, bleeding, infection and recurrence. He understands all questions were answered and he agrees to proceed.    Plan:     Laparoscopic repair of ventral incisional hernia with overnight hospitalization. Gen. Anesthesia. He would like to look at his schedule and call us back in regards to scheduling.

## 2013-01-02 ENCOUNTER — Other Ambulatory Visit (INDEPENDENT_AMBULATORY_CARE_PROVIDER_SITE_OTHER): Payer: Self-pay | Admitting: General Surgery

## 2013-01-09 ENCOUNTER — Encounter (HOSPITAL_COMMUNITY): Payer: Self-pay | Admitting: Pharmacy Technician

## 2013-01-12 ENCOUNTER — Encounter (HOSPITAL_COMMUNITY): Payer: Self-pay

## 2013-01-12 ENCOUNTER — Encounter (HOSPITAL_COMMUNITY)
Admission: RE | Admit: 2013-01-12 | Discharge: 2013-01-12 | Disposition: A | Discharge status: Home / Self Care | Payer: BC Managed Care – PPO | Source: Ambulatory Visit | Attending: General Surgery | Admitting: General Surgery

## 2013-01-12 DIAGNOSIS — M539 Dorsopathy, unspecified: Secondary | ICD-10-CM

## 2013-01-12 DIAGNOSIS — Z01810 Encounter for preprocedural cardiovascular examination: Secondary | ICD-10-CM | POA: Insufficient documentation

## 2013-01-12 DIAGNOSIS — Z01812 Encounter for preprocedural laboratory examination: Secondary | ICD-10-CM | POA: Insufficient documentation

## 2013-01-12 DIAGNOSIS — K432 Incisional hernia without obstruction or gangrene: Secondary | ICD-10-CM | POA: Insufficient documentation

## 2013-01-12 DIAGNOSIS — Z87442 Personal history of urinary calculi: Secondary | ICD-10-CM

## 2013-01-12 HISTORY — DX: Personal history of urinary calculi: Z87.442

## 2013-01-12 HISTORY — DX: Nausea with vomiting, unspecified: R11.2

## 2013-01-12 HISTORY — DX: Dorsopathy, unspecified: M53.9

## 2013-01-12 HISTORY — DX: Other specified postprocedural states: Z98.890

## 2013-01-12 LAB — BASIC METABOLIC PANEL
BUN: 8 mg/dL (ref 6–23)
CO2: 30 mEq/L (ref 19–32)
Chloride: 102 mEq/L (ref 96–112)
Creatinine, Ser: 1.14 mg/dL (ref 0.50–1.35)
GFR calc Af Amer: 83 mL/min — ABNORMAL LOW (ref >90)
Glucose, Bld: 98 mg/dL (ref 70–99)
Potassium: 4.3 mEq/L (ref 3.5–5.1)

## 2013-01-12 LAB — CBC
HCT: 45.4 % (ref 39.0–52.0)
Hemoglobin: 15.7 g/dL (ref 13.0–17.0)
MCHC: 34.6 g/dL (ref 30.0–36.0)
MCV: 94.2 fL (ref 78.0–100.0)
RDW: 14 % (ref 11.5–15.5)

## 2013-01-12 NOTE — Patient Instructions (Addendum)
20 Kevin Banks  01/12/2013   Your procedure is scheduled on:  8-1 -2014  Report to [facility] at     0530   AM.  Call this number if you have problems the morning of surgery: [phone]  Or Presurgical Testing [phone](Marveen Donlon)   Remember: Follow any bowel prep instructions per MD office.    Do not eat food:After Midnight.    Take these medicines the morning of surgery with A SIP OF WATER: Oxycodone. Benadryl.   Do not wear jewelry, make-up or nail polish.  Do not wear lotions, powders, or perfumes. You may wear deodorant.  Do not shave 12 hours prior to first CHG shower(legs and under arms).(face and neck okay.)  Do not bring valuables to the hospital.  Contacts, dentures or bridgework,body piercing,  may not be worn into surgery.  Leave suitcase in the car. After surgery it may be brought to your room.  For patients admitted to the hospital, checkout time is 11:00 AM the day of discharge.   Patients discharged the day of surgery will not be allowed to drive home. Must have responsible person with you x 24 hours once discharged.  Name and phone number of your driver: Rae-spouse [phone] cell  Special Instructions: CHG(Chlorhedine 4%-"Hibiclens","Betasept","Aplicare") Shower Use Special Wash: see special instructions.(avoid face and genitals)       Failure to follow these instructions may result in Cancellation of your surgery.   Patient signature_______________________________________________________

## 2013-01-12 NOTE — Pre-Procedure Instructions (Signed)
01-12-13 EKG done today.

## 2013-01-26 NOTE — Anesthesia Preprocedure Evaluation (Addendum)
Anesthesia Evaluation  Patient identified by MRN, date of birth, ID band Patient awake    Reviewed: Allergy & Precautions, H&P , NPO status , Patient's Chart, lab work & pertinent test results  History of Anesthesia Complications (+) PONV  Airway Mallampati: II TM Distance: >3 FB Neck ROM: full    Dental no notable dental hx. (+) Teeth Intact and Dental Advisory Given   Pulmonary neg pulmonary ROS, Current Smoker,  breath sounds clear to auscultation  Pulmonary exam normal       Cardiovascular Exercise Tolerance: Good negative cardio ROS  Rhythm:regular Rate:Normal     Neuro/Psych Spinal stenosis negative neurological ROS  negative psych ROS   GI/Hepatic negative GI ROS, Neg liver ROS, GERD-  Medicated and Controlled,  Endo/Other  negative endocrine ROS  Renal/GU negative Renal ROS  negative genitourinary   Musculoskeletal   Abdominal   Peds  Hematology negative hematology ROS (+)   Anesthesia Other Findings   Reproductive/Obstetrics negative OB ROS                          Anesthesia Physical Anesthesia Plan  ASA: II  Anesthesia Plan: General   Post-op Pain Management:    Induction: Intravenous  Airway Management Planned: Oral ETT  Additional Equipment:   Intra-op Plan:   Post-operative Plan: Extubation in OR  Informed Consent: I have reviewed the patients History and Physical, chart, labs and discussed the procedure including the risks, benefits and alternatives for the proposed anesthesia with the patient or authorized representative who has indicated his/her understanding and acceptance.   Dental Advisory Given  Plan Discussed with: CRNA and Surgeon  Anesthesia Plan Comments:         Anesthesia Quick Evaluation

## 2013-01-27 ENCOUNTER — Encounter (HOSPITAL_COMMUNITY)
Admission: RE | Disposition: A | Discharge status: Home / Self Care | Payer: Self-pay | Source: Ambulatory Visit | Attending: General Surgery

## 2013-01-27 ENCOUNTER — Ambulatory Visit (HOSPITAL_COMMUNITY): Payer: BC Managed Care – PPO | Admitting: Anesthesiology

## 2013-01-27 ENCOUNTER — Encounter (HOSPITAL_COMMUNITY): Payer: Self-pay | Admitting: Anesthesiology

## 2013-01-27 ENCOUNTER — Inpatient Hospital Stay (HOSPITAL_COMMUNITY)
Admission: RE | Admit: 2013-01-27 | Discharge: 2013-01-30 | DRG: 160 | Disposition: A | Discharge status: Home / Self Care | Payer: BC Managed Care – PPO | Source: Ambulatory Visit | Attending: General Surgery | Admitting: General Surgery

## 2013-01-27 ENCOUNTER — Encounter (HOSPITAL_COMMUNITY): Payer: Self-pay | Admitting: *Deleted

## 2013-01-27 DIAGNOSIS — K589 Irritable bowel syndrome without diarrhea: Secondary | ICD-10-CM | POA: Diagnosis present

## 2013-01-27 DIAGNOSIS — E785 Hyperlipidemia, unspecified: Secondary | ICD-10-CM | POA: Diagnosis present

## 2013-01-27 DIAGNOSIS — Z79899 Other long term (current) drug therapy: Secondary | ICD-10-CM

## 2013-01-27 DIAGNOSIS — R338 Other retention of urine: Secondary | ICD-10-CM | POA: Diagnosis not present

## 2013-01-27 DIAGNOSIS — F17200 Nicotine dependence, unspecified, uncomplicated: Secondary | ICD-10-CM | POA: Diagnosis present

## 2013-01-27 DIAGNOSIS — K219 Gastro-esophageal reflux disease without esophagitis: Secondary | ICD-10-CM | POA: Diagnosis present

## 2013-01-27 DIAGNOSIS — Z01810 Encounter for preprocedural cardiovascular examination: Secondary | ICD-10-CM

## 2013-01-27 DIAGNOSIS — K432 Incisional hernia without obstruction or gangrene: Secondary | ICD-10-CM

## 2013-01-27 DIAGNOSIS — K439 Ventral hernia without obstruction or gangrene: Secondary | ICD-10-CM

## 2013-01-27 DIAGNOSIS — Z01812 Encounter for preprocedural laboratory examination: Secondary | ICD-10-CM

## 2013-01-27 HISTORY — PX: HERNIA REPAIR: SHX51

## 2013-01-27 HISTORY — PX: VENTRAL HERNIA REPAIR: SHX424

## 2013-01-27 SURGERY — REPAIR, HERNIA, VENTRAL, LAPAROSCOPIC
Anesthesia: General | Site: Abdomen

## 2013-01-27 MED ORDER — CEFAZOLIN SODIUM-DEXTROSE 2-3 GM-% IV SOLR
2.0000 g | INTRAVENOUS | Status: AC
  Administered 2013-01-27: 2 g via INTRAVENOUS

## 2013-01-27 MED ORDER — FENTANYL CITRATE 0.05 MG/ML IJ SOLN
INTRAMUSCULAR | Status: DC | PRN
  Administered 2013-01-27 (×2): 50 ug via INTRAVENOUS
  Administered 2013-01-27: 100 ug via INTRAVENOUS
  Administered 2013-01-27: 50 ug via INTRAVENOUS

## 2013-01-27 MED ORDER — PROMETHAZINE HCL 25 MG/ML IJ SOLN: 12.5000 mg | INTRAMUSCULAR | Status: DC | PRN

## 2013-01-27 MED ORDER — PANTOPRAZOLE SODIUM 40 MG PO TBEC
40.0000 mg | DELAYED_RELEASE_TABLET | Freq: Every day | ORAL | Status: DC
  Administered 2013-01-27 – 2013-01-30 (×4): 40 mg via ORAL
  Filled 2013-01-27 (×4): qty 1

## 2013-01-27 MED ORDER — BUPIVACAINE-EPINEPHRINE PF 0.25-1:200000 % IJ SOLN
INTRAMUSCULAR | Status: AC
  Filled 2013-01-27: qty 30

## 2013-01-27 MED ORDER — OXYCODONE HCL 5 MG PO TABS
30.0000 mg | ORAL_TABLET | ORAL | Status: DC | PRN
  Administered 2013-01-27 – 2013-01-30 (×10): 30 mg via ORAL
  Filled 2013-01-27 (×11): qty 6

## 2013-01-27 MED ORDER — GLYCOPYRROLATE 0.2 MG/ML IJ SOLN
INTRAMUSCULAR | Status: DC | PRN
  Administered 2013-01-27: .7 mg via INTRAVENOUS

## 2013-01-27 MED ORDER — PROMETHAZINE HCL 25 MG/ML IJ SOLN
12.5000 mg | INTRAMUSCULAR | Status: DC | PRN
Start: 2013-01-27 — End: 2013-01-27

## 2013-01-27 MED ORDER — HEPARIN SODIUM (PORCINE) 5000 UNIT/ML IJ SOLN
5000.0000 [IU] | Freq: Three times a day (TID) | INTRAMUSCULAR | Status: DC
  Administered 2013-01-27 – 2013-01-30 (×8): 5000 [IU] via SUBCUTANEOUS
  Filled 2013-01-27 (×11): qty 1

## 2013-01-27 MED ORDER — ONDANSETRON HCL 4 MG PO TABS: 4.0000 mg | ORAL_TABLET | Freq: Four times a day (QID) | ORAL | Status: DC | PRN

## 2013-01-27 MED ORDER — HYDROMORPHONE HCL PF 1 MG/ML IJ SOLN
INTRAMUSCULAR | Status: AC
  Filled 2013-01-27: qty 1

## 2013-01-27 MED ORDER — ONDANSETRON HCL 4 MG/2ML IJ SOLN: 4.0000 mg | Freq: Four times a day (QID) | INTRAMUSCULAR | Status: DC | PRN

## 2013-01-27 MED ORDER — ONDANSETRON HCL 4 MG/2ML IJ SOLN
INTRAMUSCULAR | Status: DC | PRN
  Administered 2013-01-27: 4 mg via INTRAVENOUS

## 2013-01-27 MED ORDER — MIDAZOLAM HCL 5 MG/5ML IJ SOLN
INTRAMUSCULAR | Status: DC | PRN
  Administered 2013-01-27: 2 mg via INTRAVENOUS

## 2013-01-27 MED ORDER — LACTATED RINGERS IV SOLN
INTRAVENOUS | Status: DC | PRN
  Administered 2013-01-27: 07:00:00 via INTRAVENOUS

## 2013-01-27 MED ORDER — HYDROMORPHONE HCL PF 1 MG/ML IJ SOLN
0.2500 mg | INTRAMUSCULAR | Status: DC | PRN
  Administered 2013-01-27 (×4): 0.5 mg via INTRAVENOUS

## 2013-01-27 MED ORDER — BUPIVACAINE-EPINEPHRINE 0.25% -1:200000 IJ SOLN
INTRAMUSCULAR | Status: DC | PRN
  Administered 2013-01-27: 30 mL

## 2013-01-27 MED ORDER — 0.9 % SODIUM CHLORIDE (POUR BTL) OPTIME
TOPICAL | Status: DC | PRN
  Administered 2013-01-27: 1000 mL

## 2013-01-27 MED ORDER — PROMETHAZINE HCL 25 MG/ML IJ SOLN
INTRAMUSCULAR | Status: AC
  Filled 2013-01-27: qty 1

## 2013-01-27 MED ORDER — CHLORHEXIDINE GLUCONATE 4 % EX LIQD
1.0000 "application " | Freq: Once | CUTANEOUS | Status: DC
  Filled 2013-01-27: qty 15

## 2013-01-27 MED ORDER — NEOSTIGMINE METHYLSULFATE 1 MG/ML IJ SOLN
INTRAMUSCULAR | Status: DC | PRN
  Administered 2013-01-27: 4 mg via INTRAVENOUS

## 2013-01-27 MED ORDER — HYDROMORPHONE HCL PF 1 MG/ML IJ SOLN
INTRAMUSCULAR | Status: DC | PRN
  Administered 2013-01-27: 0.5 mg via INTRAVENOUS
  Administered 2013-01-27: 1 mg via INTRAVENOUS
  Administered 2013-01-27: 0.5 mg via INTRAVENOUS

## 2013-01-27 MED ORDER — LIDOCAINE HCL (CARDIAC) 20 MG/ML IV SOLN
INTRAVENOUS | Status: DC | PRN
  Administered 2013-01-27: 50 mg via INTRAVENOUS

## 2013-01-27 MED ORDER — CEFAZOLIN SODIUM-DEXTROSE 2-3 GM-% IV SOLR
INTRAVENOUS | Status: AC
  Filled 2013-01-27: qty 50

## 2013-01-27 MED ORDER — PROPOFOL 10 MG/ML IV BOLUS
INTRAVENOUS | Status: DC | PRN
  Administered 2013-01-27: 150 mg via INTRAVENOUS

## 2013-01-27 MED ORDER — PROMETHAZINE HCL 25 MG/ML IJ SOLN
INTRAMUSCULAR | Status: DC | PRN
  Administered 2013-01-27: 12.5 mg via INTRAVENOUS

## 2013-01-27 MED ORDER — LACTATED RINGERS IV SOLN: INTRAVENOUS | Status: DC

## 2013-01-27 MED ORDER — KCL IN DEXTROSE-NACL 20-5-0.9 MEQ/L-%-% IV SOLN
INTRAVENOUS | Status: DC
  Administered 2013-01-27 – 2013-01-28 (×2): via INTRAVENOUS
  Administered 2013-01-28 – 2013-01-29 (×3): 20 mL/h via INTRAVENOUS
  Filled 2013-01-27 (×5): qty 1000

## 2013-01-27 MED ORDER — ROCURONIUM BROMIDE 100 MG/10ML IV SOLN
INTRAVENOUS | Status: DC | PRN
  Administered 2013-01-27: 40 mg via INTRAVENOUS

## 2013-01-27 MED ORDER — HYDROMORPHONE HCL PF 1 MG/ML IJ SOLN
1.0000 mg | INTRAMUSCULAR | Status: DC | PRN
  Administered 2013-01-27 (×2): 2 mg via INTRAVENOUS
  Administered 2013-01-27: 1 mg via INTRAVENOUS
  Administered 2013-01-27 (×3): 2 mg via INTRAVENOUS
  Administered 2013-01-27: 1 mg via INTRAVENOUS
  Administered 2013-01-28 (×3): 3 mg via INTRAVENOUS
  Administered 2013-01-28: 2 mg via INTRAVENOUS
  Administered 2013-01-28 – 2013-01-30 (×8): 3 mg via INTRAVENOUS
  Filled 2013-01-27: qty 3
  Filled 2013-01-27 (×2): qty 2
  Filled 2013-01-27 (×5): qty 3
  Filled 2013-01-27: qty 1
  Filled 2013-01-27: qty 3
  Filled 2013-01-27: qty 2
  Filled 2013-01-27: qty 1
  Filled 2013-01-27 (×4): qty 3
  Filled 2013-01-27 (×3): qty 2

## 2013-01-27 MED ORDER — EPHEDRINE SULFATE 50 MG/ML IJ SOLN
INTRAMUSCULAR | Status: DC | PRN
  Administered 2013-01-27: 10 mg via INTRAVENOUS

## 2013-01-27 SURGICAL SUPPLY — 53 items
APPLIER CLIP 5 13 M/L LIGAMAX5 (MISCELLANEOUS)
BENZOIN TINCTURE PRP APPL 2/3 (GAUZE/BANDAGES/DRESSINGS) IMPLANT
BINDER ABD UNIV 12 45-62 (WOUND CARE) IMPLANT
BINDER ABDOMINAL 46IN 62IN (WOUND CARE)
CANISTER SUCTION 2500CC (MISCELLANEOUS) ×2 IMPLANT
CHLORAPREP W/TINT 26ML (MISCELLANEOUS) ×2 IMPLANT
CLIP APPLIE 5 13 M/L LIGAMAX5 (MISCELLANEOUS) IMPLANT
CLOTH BEACON ORANGE TIMEOUT ST (SAFETY) ×2 IMPLANT
COVER SURGICAL LIGHT HANDLE (MISCELLANEOUS) ×2 IMPLANT
DECANTER SPIKE VIAL GLASS SM (MISCELLANEOUS) ×2 IMPLANT
DERMABOND ADVANCED (GAUZE/BANDAGES/DRESSINGS) ×1
DERMABOND ADVANCED .7 DNX12 (GAUZE/BANDAGES/DRESSINGS) ×1 IMPLANT
DEVICE SECURE STRAP 25 ABSORB (INSTRUMENTS) ×2 IMPLANT
DEVICE TROCAR PUNCTURE CLOSURE (ENDOMECHANICALS) IMPLANT
DISSECTOR BLUNT TIP ENDO 5MM (MISCELLANEOUS) IMPLANT
DRAPE LAPAROSCOPIC ABDOMINAL (DRAPES) ×2 IMPLANT
DRAPE UTILITY XL STRL (DRAPES) ×2 IMPLANT
ELECT REM PT RETURN 9FT ADLT (ELECTROSURGICAL) ×2
ELECTRODE REM PT RTRN 9FT ADLT (ELECTROSURGICAL) ×1 IMPLANT
GLOVE BIOGEL PI IND STRL 7.0 (GLOVE) ×1 IMPLANT
GLOVE BIOGEL PI INDICATOR 7.0 (GLOVE) ×1
GLOVE SURG SS PI 7.5 STRL IVOR (GLOVE) ×2 IMPLANT
GOWN STRL NON-REIN LRG LVL3 (GOWN DISPOSABLE) ×2 IMPLANT
GOWN STRL REIN XL XLG (GOWN DISPOSABLE) ×4 IMPLANT
KIT BASIN OR (CUSTOM PROCEDURE TRAY) ×2 IMPLANT
MARKER SKIN DUAL TIP RULER LAB (MISCELLANEOUS) ×2 IMPLANT
MESH VENTRALIGHT ST 6X8 (Mesh Specialty) ×1 IMPLANT
MESH VENTRLGHT ELLIPSE 8X6XMFL (Mesh Specialty) ×1 IMPLANT
NEEDLE SPNL 22GX3.5 QUINCKE BK (NEEDLE) ×2 IMPLANT
NS IRRIG 1000ML POUR BTL (IV SOLUTION) ×2 IMPLANT
PENCIL BUTTON HOLSTER BLD 10FT (ELECTRODE) IMPLANT
SCALPEL HARMONIC ACE (MISCELLANEOUS) ×2 IMPLANT
SCISSORS LAP 5X35 DISP (ENDOMECHANICALS) ×2 IMPLANT
SET IRRIG TUBING LAPAROSCOPIC (IRRIGATION / IRRIGATOR) IMPLANT
SLEEVE ADV FIXATION 5X100MM (TROCAR) IMPLANT
SLEEVE Z-THREAD 5X100MM (TROCAR) IMPLANT
SOLUTION ANTI FOG 6CC (MISCELLANEOUS) ×2 IMPLANT
STRIP CLOSURE SKIN 1/2X4 (GAUZE/BANDAGES/DRESSINGS) IMPLANT
SUT MNCRL AB 4-0 PS2 18 (SUTURE) ×2 IMPLANT
SUT NOVA NAB DX-16 0-1 5-0 T12 (SUTURE) ×4 IMPLANT
SUT NOVA NAB GS-21 0 18 T12 DT (SUTURE) IMPLANT
SUT PROLENE 0 CT 1 CR/8 (SUTURE) IMPLANT
TACKER 5MM HERNIA 3.5CML NAB (ENDOMECHANICALS) ×2 IMPLANT
TOWEL OR 17X26 10 PK STRL BLUE (TOWEL DISPOSABLE) ×2 IMPLANT
TRAY FOLEY CATH 14FRSI W/METER (CATHETERS) IMPLANT
TRAY LAP CHOLE (CUSTOM PROCEDURE TRAY) ×2 IMPLANT
TROCAR ADV FIXATION 11X100MM (TROCAR) IMPLANT
TROCAR ADV FIXATION 5X100MM (TROCAR) IMPLANT
TROCAR BLADELESS OPT 5 100 (ENDOMECHANICALS) ×2 IMPLANT
TROCAR BLADELESS OPT 5 75 (ENDOMECHANICALS) IMPLANT
TROCAR XCEL NON-BLD 11X100MML (ENDOMECHANICALS) IMPLANT
TROCAR XCEL UNIV SLVE 11M 100M (ENDOMECHANICALS) IMPLANT
TUBING INSUFFLATION 10FT LAP (TUBING) ×2 IMPLANT

## 2013-01-27 NOTE — H&P (Signed)
HPI  Patient is a 52 year old male well known to me status post sigmoid colectomy for diverticulitis with abscess in 2004. He states at that time that he had a small umbilical hernia that I primarily repaired in closure of his incision. In the last couple of years he has developed an uncomfortable bulge just above his umbilicus. It is occasionally painful. The patient has chronic GI symptoms including gas and bloating as well as significant chronic reflux that is moderately well controlled with proton pump inhibitors and elevating the head of his bed. He has local symptoms at the hernia and was also wondering if intestinal symptoms may be related to this. He feels is getting larger over time.    Past Medical History   Diagnosis  Date   .  Hiatal hernia    .  GERD (gastroesophageal reflux disease)    .  Diverticulosis    .  Hemorrhoids    .  Diverticulitis    .  Erosive esophagitis    .  IBS (irritable bowel syndrome)    .  Spinal disorder    .  Hyperlipidemia     Past Surgical History   Procedure  Laterality  Date   .  Back surgery     .  Neck surgery     .  Hand surgery     .  Ankle reconstruction     .  Tonsillectomy     .  Appendectomy     .  Lower back       dec 2011, feb 2012   .  Colon surgery      Current Outpatient Prescriptions   Medication  Sig  Dispense  Refill   .  DiphenhydrAMINE HCl (BENADRYL ALLERGY PO)  Take 1 tablet by mouth daily.     Marland Kitchen  esomeprazole (NEXIUM) 40 MG capsule  Take 40 mg by mouth daily before breakfast.     .  oxycodone (ROXICODONE) 30 MG immediate release tablet  Take 30 mg by mouth every 4 (four) hours as needed.      No current facility-administered medications for this visit.    Allergies   Allergen  Reactions   .  Morphine And Related  Itching    History   Substance Use Topics   .  Smoking status:  Current Every Day Smoker -- 0.25 packs/day   .  Smokeless tobacco:  Never Used   .  Alcohol Use:  Yes      Comment: rarely   Review of  Systems  Constitutional: Negative.  Respiratory: Negative.  Cardiovascular: Negative.  Gastrointestinal: Positive for nausea, abdominal pain and abdominal distention. Negative for vomiting.  GERD and reflux  Musculoskeletal: Positive for back pain.   Objective:   Physical Exam  BP 161/97  Pulse 88  Temp(Src) 98.4 F (36.9 C) (Oral)  Resp 18  SpO2 96%  General: Alert, well-developed Caucasian male, in no distress  Skin: Warm and dry without rash or infection.  HEENT: No palpable masses or thyromegaly. Sclera nonicteric. Pupils equal round and reactive. Oropharynx clear.  Lymph nodes: No cervical, supraclavicular, or inguinal nodes palpable.  Lungs: Breath sounds clear and equal without increased work of breathing  Cardiovascular: Regular rate and rhythm without murmur. No JVD or edema. Peripheral pulses intact.  Abdomen: Nondistended. Soft and nontender. No masses palpable. No organomegaly. Well-healed low midline incision up to the umbilicus. Just above the umbilicus is a small but definite midline hernia which appears to pop through  a fairly small defect on palpation. It is tender.  Extremities: No edema or joint swelling or deformity. No chronic venous stasis changes.  Neurologic: Alert and fully oriented. Gait normal.   Assessment:   Symptomatic ventral/incisional hernia. This is giving local pain and symptoms. He also has chronic GI complaints including significant reflux. I told him I did not think that repairing his hernia would have any positive effect on these. He would very much like to have a symptomatic ventral incisional hernia repaired. We discussed laparoscopic repair and indications as well as risks of anesthetic complications, visceral injury, bleeding, infection and recurrence. He understands all questions were answered and he agrees to proceed.   Plan:   Laparoscopic repair of ventral incisional hernia with overnight hospitalization. Gen. Anesthesia.   Mariella Saa MD, FACS  01/27/2013, 7:20 AM

## 2013-01-27 NOTE — Op Note (Signed)
Preoperative Diagnosis: VENTRAL INCISIONAL HERNIA   Postoprative Diagnosis: VENTRAL INCISIONAL HERNIA   Procedure: Procedure(s): LAPAROSCOPIC VENTRAL incisional HERNIA REPAIR   Surgeon: Glenna Fellows T   Assistants: None  Anesthesia:  General endotracheal anesthesia  Indications: patient is a 52 year old male with a previous history of open emergency sigmoid colectomy through a low midline incision up to the umbilicus. He now presents with a symptomatic reducible ventral hernia just above the umbilicus at the top end of his incision. We elected to proceed with laparoscopic repair after discussion of alternatives and risks detailed extensively elsewhere.  Procedure Detail:  Patient is brought to the operating room, placed in the supine position on the operating table, and general endotracheal anesthesia induced. He received preoperative IV antibiotics. PAS replaced. The abdomen was widely sterilely prepped and draped. Patient time out was performed and correct procedure verified. Access was obtained in the right upper quadrant laterally with a 5 mm Optiview trocar without difficulty and pneumoperitoneum established. There were minimal omental adhesions to the midline. Under direct vision a 5 mm trocar was placed laterally in the right mid abdomen and an 11 mm trocar laterally in the right lower quadrant. Omental adhesions were taken down from the previous low midline incision using the harmonic scalpel. Just above the incision was a hernia defect which was at the lower end of the falciform ligament. The falciform ligament was taken down with cautery. The hernia contained preperitoneal fat from the falciform ligament that was completely reduced and the falciform ligament was completely taken down to allow wide placement of mesh against the abdominal wall. The defect did not measure anymore then 3 cm in diameter. I chose a 15 x 20 cm Bard VentralLight piece of mesh to allow very wide coverage of the  defect in the incision. 8 stay sutures of #1 Novafil were placed circumferentially around the edge of the mesh. The mesh was moistened and rolled and introduced into the abdomen and then oriented. I previously made 8 small stab incisions corresponding to the sutures and through these using the Endo Close device the sutures were brought up through the anterior abdominal wall and the mesh was very nicely widely and partly deployed. The sutures were secured. The mesh was then tacked circumferentially with the Securestrap tacker using an inner row around the hernia defect as well. The abdomen was inspected for hemostasis and there was no bleeding or other problem. All CO2 was evacuated and trochars removed. Skin incisions were closed with subcuticular Monocryl and Dermabond. Sponge needle and instrument counts were correct.   Estimated Blood Loss:  Minimal         Drains: none  Blood Given: none          Specimens: none        Complications:  * No complications entered in OR log *         Disposition: PACU - hemodynamically stable.         Condition: stable

## 2013-01-27 NOTE — Transfer of Care (Signed)
Immediate Anesthesia Transfer of Care Note  Patient: Kevin Banks  Procedure(s) Performed: Procedure(s) (LRB): LAPAROSCOPIC VENTRAL incisional HERNIA (N/A)  Patient Location: PACU  Anesthesia Type: General  Level of Consciousness: sedated, patient cooperative and responds to stimulaton  Airway & Oxygen Therapy: Patient Spontanous Breathing and Patient connected to face mask oxgen  Post-op Assessment: Report given to PACU RN and Post -op Vital signs reviewed and stable  Post vital signs: Reviewed and stable  Complications: No apparent anesthesia complications

## 2013-01-27 NOTE — Anesthesia Postprocedure Evaluation (Signed)
  Anesthesia Post-op Note  Patient: Kevin Banks  Procedure(s) Performed: Procedure(s) (LRB): LAPAROSCOPIC VENTRAL incisional HERNIA (N/A)  Patient Location: PACU  Anesthesia Type: General  Level of Consciousness: awake and alert   Airway and Oxygen Therapy: Patient Spontanous Breathing  Post-op Pain: mild  Post-op Assessment: Post-op Vital signs reviewed, Patient's Cardiovascular Status Stable, Respiratory Function Stable, Patent Airway and No signs of Nausea or vomiting  Last Vitals:  Filed Vitals:   01/27/13 1047  BP: 152/92  Pulse: 62  Temp: 37.2 C  Resp: 16    Post-op Vital Signs: stable   Complications: No apparent anesthesia complications

## 2013-01-28 LAB — CBC
HCT: 41.6 % (ref 39.0–52.0)
MCH: 32.5 pg (ref 26.0–34.0)
MCV: 95.2 fL (ref 78.0–100.0)
Platelets: 197 10*3/uL (ref 150–400)
RBC: 4.37 MIL/uL (ref 4.22–5.81)

## 2013-01-28 LAB — BASIC METABOLIC PANEL
CO2: 29 mEq/L (ref 19–32)
Calcium: 8.6 mg/dL (ref 8.4–10.5)
Chloride: 102 mEq/L (ref 96–112)
Creatinine, Ser: 1.1 mg/dL (ref 0.50–1.35)
Glucose, Bld: 102 mg/dL — ABNORMAL HIGH (ref 70–99)

## 2013-01-28 MED ORDER — TAMSULOSIN HCL 0.4 MG PO CAPS
0.4000 mg | ORAL_CAPSULE | Freq: Every day | ORAL | Status: DC
  Administered 2013-01-28 – 2013-01-30 (×3): 0.4 mg via ORAL
  Filled 2013-01-28 (×3): qty 1

## 2013-01-28 NOTE — Progress Notes (Signed)
Patient c/o abd discomfort with inability to void on own. Cranberry juice refused. Bladder scan 825cc urine. Patient  agreeable to I&O cath with return of 800cc clear yellow urine. Relief voiced.

## 2013-01-28 NOTE — Progress Notes (Signed)
[name], male   DOB: [birth year], 52 y.o.   MRN: [id number] 1 Day Post-Op  Subjective: A lot of pain yesterday but significantly better this morning. No nausea. Has not been able to void adequately, still requiring I&O catheter  Objective: Vital signs in last 24 hours: Temp:  [98 F (36.7 C)-99 F (37.2 C)] 98 F (36.7 C) (08/02 0500) Pulse Rate:  [61-89] 68 (08/02 0500) Resp:  [14-20] 18 (08/02 0500) BP: (116-159)/(71-92) 128/79 mmHg (08/02 0500) SpO2:  [96 %-100 %] 98 % (08/02 0500) Weight:  [195 lb 6 oz (88.622 kg)] 195 lb 6 oz (88.622 kg) (08/01 1800) Last BM Date: 01/27/13  Intake/Output from previous day: 08/01 0701 - 08/02 0700 In: 2808.8 [P.O.:340; I.V.:2468.8] Out: 2375 [Urine:2375] Intake/Output this shift:    General appearance: alert, cooperative and no distress GI: abnormal findings:  mild tenderness in the entire abdomen and nondistended Incision/Wound: incisions clean and dry  Lab Results:   Recent Labs  01/28/13 0431  WBC 8.4  HGB 14.2  HCT 41.6  PLT 197   BMET  Recent Labs  01/28/13 0431  NA 136  K 3.6  CL 102  CO2 29  GLUCOSE 102*  BUN 7  CREATININE 1.10  CALCIUM 8.6     Studies/Results: No results found.  Anti-infectives: Anti-infectives   Start     Dose/Rate Route Frequency Ordered Stop   01/27/13 0558  ceFAZolin (ANCEF) IVPB 2 g/50 mL premix     2 g 100 mL/hr over 30 Minutes Intravenous On call to O.R. 01/27/13 4098 01/27/13 0753      Assessment/Plan: s/p Procedure(s): LAPAROSCOPIC VENTRAL incisional HERNIA Generally doing well postoperatively. Patient is on chronic narcotics at home which I think is made pain management a little more difficult. Much better this morning. Urinary retention. Start Flomax and continue in and out catheter when necessary   LOS: 1 day    Kevin Banks T 01/28/2013

## 2013-01-29 NOTE — Progress Notes (Signed)
[name], male   DOB: [birth year], 52 y.o.   MRN: [id number] 2 Days Post-Op  Subjective: No severe pain but a lot of discomfort when up and moving around. Still not able to void yesterday and Foley catheter was placed. Tolerating liquid diet without nausea or vomiting.  Objective: Vital signs in last 24 hours: Temp:  [98.3 F (36.8 C)-98.9 F (37.2 C)] 98.5 F (36.9 C) (08/03 0630) Pulse Rate:  [73-79] 79 (08/03 0630) Resp:  [18-20] 18 (08/03 0630) BP: (119-130)/(77-85) 119/77 mmHg (08/03 0630) SpO2:  [91 %-100 %] 91 % (08/03 0630) Last BM Date: 01/27/13  Intake/Output from previous day: 08/02 0701 - 08/03 0700 In: 1850.1 [P.O.:920; I.V.:930.1] Out: 2750 [Urine:2750] Intake/Output this shift:    General appearance: alert, cooperative and no distress GI: moderate anterior tenderness over the repair Incision/Wound: clean and dry  Lab Results:   Recent Labs  01/28/13 0431  WBC 8.4  HGB 14.2  HCT 41.6  PLT 197   BMET  Recent Labs  01/28/13 0431  NA 136  K 3.6  CL 102  CO2 29  GLUCOSE 102*  BUN 7  CREATININE 1.10  CALCIUM 8.6     Studies/Results: No results found.  Anti-infectives: Anti-infectives   Start     Dose/Rate Route Frequency Ordered Stop   01/27/13 0558  ceFAZolin (ANCEF) IVPB 2 g/50 mL premix     2 g 100 mL/hr over 30 Minutes Intravenous On call to O.R. 01/27/13 0558 01/27/13 0753      Assessment/Plan: s/p Procedure(s): LAPAROSCOPIC VENTRAL incisional HERNIA Urinary retention.Will leave Foley catheter today. On Flomax. Possibly home tomorrow.   LOS: 2 days    Kahron Kauth T 01/29/2013

## 2013-01-30 ENCOUNTER — Encounter (HOSPITAL_COMMUNITY): Payer: Self-pay | Admitting: General Surgery

## 2013-01-30 MED ORDER — TAMSULOSIN HCL 0.4 MG PO CAPS: 0.4000 mg | ORAL_CAPSULE | Freq: Every day | ORAL | Status: DC

## 2013-01-30 MED ORDER — POLYETHYLENE GLYCOL 3350 17 G PO PACK
17.0000 g | PACK | Freq: Every day | ORAL | Status: DC
  Administered 2013-01-30: 17 g via ORAL
  Filled 2013-01-30: qty 1

## 2013-01-30 NOTE — Progress Notes (Signed)
[name], male   DOB: [birth year], 52 y.o.   MRN: [id number] 3 Days Post-Op  Subjective: Incisional pain is gradually better. Tolerating full liquid diet though no bowel movement yet.  Objective: Vital signs in last 24 hours: Temp:  [98.3 F (36.8 C)-98.5 F (36.9 C)] 98.3 F (36.8 C) (08/03 2100) Pulse Rate:  [76-90] 76 (08/03 2100) Resp:  [18] 18 (08/03 2100) BP: (113-140)/(71-83) 140/83 mmHg (08/03 2100) SpO2:  [91 %-97 %] 97 % (08/03 2100) Last BM Date: 01/27/13  Intake/Output from previous day: 08/03 0701 - 08/04 0700 In: 1000 [P.O.:600; I.V.:400] Out: 1500 [Urine:1500] Intake/Output this shift: Total I/O In: 171 [I.V.:171] Out: 600 [Urine:600]  General appearance: alert, cooperative and no distress Lungs: Clear equal breath sounds Abdomen: Soft and nontender. Nondistended. Incision is healing well.  Lab Results:   Recent Labs  01/28/13 0431  WBC 8.4  HGB 14.2  HCT 41.6  PLT 197   BMET  Recent Labs  01/28/13 0431  NA 136  K 3.6  CL 102  CO2 29  GLUCOSE 102*  BUN 7  CREATININE 1.10  CALCIUM 8.6     Studies/Results: No results found.  Anti-infectives: Anti-infectives   Start     Dose/Rate Route Frequency Ordered Stop   01/27/13 0558  ceFAZolin (ANCEF) IVPB 2 g/50 mL premix     2 g 100 mL/hr over 30 Minutes Intravenous On call to O.R. 01/27/13 4098 01/27/13 0753      Assessment/Plan: s/p Procedure(s): LAPAROSCOPIC VENTRAL incisional HERNIA Gradually improving. Urinary retention: Will try Foley out this morning for voiding trial. If he is able to void and pain is controlled on oral medications we will plan discharge later today.   LOS: 3 days    Maegan Buller T 01/30/2013

## 2013-01-30 NOTE — Discharge Summary (Signed)
   [name] [id number] 51 y.o. 04/02/1961  01/27/2013  Discharge date and time: 01/30/2013   Admitting Physician: Glenna Fellows T  Discharge Physician: Glenna Fellows T  Admission Diagnoses: VENTRAL INCISIONAL HERNIA   Discharge Diagnoses:  Same  Operations: Procedure(s): LAPAROSCOPIC VENTRAL incisional HERNIA  Admission Condition: Good  Discharged Condition: Good  Indication for Admission: Pt with symptomatic moderate sized ventral incisional hernia admitted electively for repair  Hospital Course: Underwent uneventful laparoscopic ventral incisional hernia repair. Post op course complicated by urinary retention requiring Foley catheter.  Flomax started and catheter removed 8/4 and pt able tot void without difficulty.  At discharge he is abulatory, tolerating diet well. Abdomen non tender and wounds clean.  Pain controlled on oral meds.   Disposition: Home  Patient Instructions:    Medication List         BENADRYL ALLERGY PO  Take 1 tablet by mouth daily.     esomeprazole 40 MG capsule  Commonly known as:  NEXIUM  Take 40 mg by mouth daily before breakfast. Takes at bedtime     oxycodone 30 MG immediate release tablet  Commonly known as:  ROXICODONE  Take 30 mg by mouth every 4 (four) hours as needed for pain.     tamsulosin 0.4 MG Caps capsule  Commonly known as:  FLOMAX  Take 1 capsule (0.4 mg total) by mouth daily.        Activity: No heavy lifting for 4 weeks Diet: Regular Wound Care: None  Follow-up:  With Dr Johna Sheriff in 2 weeks  Signed: Mariella Saa MD, FACS  01/30/2013, 12:58 PM

## 2013-01-30 NOTE — Progress Notes (Signed)
I have spoken to Dr. Johna Sheriff, informed him patient voided 2 times in urinal and total , patient states he feels like he is emptying his bladder. MD put in order for discharge. States understanding of discharge instructions.

## 2013-02-03 ENCOUNTER — Telehealth (INDEPENDENT_AMBULATORY_CARE_PROVIDER_SITE_OTHER): Payer: Self-pay

## 2013-02-03 NOTE — Telephone Encounter (Signed)
Needs sooner  P/O appt than 03/10/13 please advise

## 2013-02-07 ENCOUNTER — Telehealth (INDEPENDENT_AMBULATORY_CARE_PROVIDER_SITE_OTHER): Payer: Self-pay

## 2013-02-07 NOTE — Telephone Encounter (Signed)
Called and left message for patient re:  Please make patient aware that patient post op date has been changed to 02/16/13 @ 11:15 am w/Dr. Johna Sheriff per the patient's request.

## 2013-02-16 ENCOUNTER — Encounter (INDEPENDENT_AMBULATORY_CARE_PROVIDER_SITE_OTHER): Payer: Self-pay | Admitting: General Surgery

## 2013-02-16 ENCOUNTER — Ambulatory Visit (INDEPENDENT_AMBULATORY_CARE_PROVIDER_SITE_OTHER): Payer: BC Managed Care – PPO | Admitting: General Surgery

## 2013-02-16 VITALS — BP 130/80 | HR 78 | Temp 97.0°F | Resp 16 | Ht 72.0 in | Wt 191.4 lb

## 2013-02-16 DIAGNOSIS — Z09 Encounter for follow-up examination after completed treatment for conditions other than malignant neoplasm: Secondary | ICD-10-CM

## 2013-02-16 NOTE — Progress Notes (Signed)
History: Patient returns for followup approximately 3 weeks following laparoscopic repair of ventral incisional hernia. He had quite a bit of pain for the first couple of days but this quickly improved and he now just has some soreness and is feeling steadily better. He has noticed a little lump at the previous hernia site.  Exam: BP 130/80  Pulse 78  Temp(Src) 97 F (36.1 C) (Temporal)  Resp 16  Ht 6' (1.829 m)  Wt 191 lb 6.4 oz (86.818 kg)  BMI 25.95 kg/m2 Abdomen is soft and nontender. Incisions all well healed. There is a palpable seroma at the previous hernia sac. After explaining the procedure under sterile technique I aspirated this and removed about 12 cc of serosanguineous fluid with complete resolution.  Assessment and plan: Doing well following ventral hernia repair as above. Return in one month for what should be a final check.

## 2013-03-10 ENCOUNTER — Encounter (INDEPENDENT_AMBULATORY_CARE_PROVIDER_SITE_OTHER): Payer: BC Managed Care – PPO | Admitting: General Surgery

## 2013-03-24 ENCOUNTER — Encounter (INDEPENDENT_AMBULATORY_CARE_PROVIDER_SITE_OTHER): Payer: Self-pay | Admitting: General Surgery

## 2013-03-24 ENCOUNTER — Ambulatory Visit (INDEPENDENT_AMBULATORY_CARE_PROVIDER_SITE_OTHER): Payer: BC Managed Care – PPO | Admitting: General Surgery

## 2013-03-24 VITALS — BP 130/92 | HR 80 | Temp 97.7°F | Resp 16 | Ht 72.0 in | Wt 197.0 lb

## 2013-03-24 DIAGNOSIS — Z09 Encounter for follow-up examination after completed treatment for conditions other than malignant neoplasm: Secondary | ICD-10-CM

## 2013-03-24 NOTE — Patient Instructions (Addendum)
No activity limitations. Call as needed for any concerns. 

## 2013-03-24 NOTE — Progress Notes (Signed)
History: The patient returns for more long-term followup after laparoscopic ventral hernia repair. We aspirated a small seroma at the hernia site last visit. He states the lump came back although smaller but over the last week or 2 has continued to shrink. He has minimal occasional twinge of discomfort out around the area of the stay sutures that continues to improve. He is back at work at full activity.  Exam: Abdomen is soft and nontender. Hernia repair feels solid. There is a tiny probably pea-sized firm area at the site of the old hernia sac.  Assessment and plan: Doing well following laparoscopic ventral hernia repair. No complications identified. He is discharged and will return as needed.

## 2013-05-04 ENCOUNTER — Other Ambulatory Visit: Payer: Self-pay

## 2013-08-07 ENCOUNTER — Other Ambulatory Visit: Payer: Self-pay | Admitting: Neurosurgery

## 2013-08-07 DIAGNOSIS — M47816 Spondylosis without myelopathy or radiculopathy, lumbar region: Secondary | ICD-10-CM

## 2013-08-09 ENCOUNTER — Ambulatory Visit
Admission: RE | Admit: 2013-08-09 | Discharge: 2013-08-09 | Disposition: A | Discharge status: Home / Self Care | Payer: BC Managed Care – PPO | Source: Ambulatory Visit | Attending: Neurosurgery | Admitting: Neurosurgery

## 2013-08-09 VITALS — BP 85/55 | HR 79

## 2013-08-09 DIAGNOSIS — M47816 Spondylosis without myelopathy or radiculopathy, lumbar region: Secondary | ICD-10-CM

## 2013-08-09 DIAGNOSIS — M5137 Other intervertebral disc degeneration, lumbosacral region: Secondary | ICD-10-CM

## 2013-08-09 MED ORDER — ONDANSETRON HCL 4 MG/2ML IJ SOLN
4.0000 mg | Freq: Once | INTRAMUSCULAR | Status: AC
  Administered 2013-08-09: 4 mg via INTRAMUSCULAR

## 2013-08-09 MED ORDER — IOHEXOL 180 MG/ML  SOLN
18.0000 mL | Freq: Once | INTRAMUSCULAR | Status: AC | PRN
  Administered 2013-08-09: 18 mL via INTRATHECAL

## 2013-08-09 MED ORDER — MEPERIDINE HCL 100 MG/ML IJ SOLN
100.0000 mg | Freq: Once | INTRAMUSCULAR | Status: AC
  Administered 2013-08-09: 100 mg via INTRAMUSCULAR

## 2013-08-09 MED ORDER — DIAZEPAM 5 MG PO TABS
10.0000 mg | ORAL_TABLET | Freq: Once | ORAL | Status: AC
  Administered 2013-08-09: 5 mg via ORAL

## 2013-08-09 NOTE — Discharge Instructions (Signed)

## 2013-09-05 ENCOUNTER — Other Ambulatory Visit: Payer: Self-pay | Admitting: Neurosurgery

## 2013-09-11 ENCOUNTER — Encounter (HOSPITAL_COMMUNITY): Payer: Self-pay | Admitting: Pharmacy Technician

## 2013-09-13 NOTE — Pre-Procedure Instructions (Signed)
Kevin KennedyDaniel J Banks  09/13/2013   Your procedure is scheduled on:   Tuesday  09/22/13  [facility]  2 * 3 at 600 AM.  Call this number if you have problems the morning of surgery: [phone]   Remember:   Do not eat food or drink liquids after midnight.   Take these medicines the morning of surgery with A SIP OF WATER:  ROXICODONE IF NEEDED   Do not wear jewelry, make-up or nail polish.  Do not wear lotions, powders, or perfumes. You may wear deodorant.  Do not shave 48 hours prior to surgery. Men may shave face and neck.  Do not bring valuables to the hospital.  [facility] is not responsible                  for any belongings or valuables.               Contacts, dentures or bridgework may not be worn into surgery.  Leave suitcase in the car. After surgery it may be brought to your room.  For patients admitted to the hospital, discharge time is determined by your                treatment team.               Patients discharged the day of surgery will not be allowed to drive  home.  Name and phone number of your driver:   Special Instructions:  Special Instructions: [facility] - Preparing for Surgery  Before surgery, you can play an important role.  Because skin is not sterile, your skin needs to be as free of germs as possible.  You can reduce the number of germs on you skin by washing with CHG (chlorahexidine gluconate) soap before surgery.  CHG is an antiseptic cleaner which kills germs and bonds with the skin to continue killing germs even after washing.  Please DO NOT use if you have an allergy to CHG or antibacterial soaps.  If your skin becomes reddened/irritated stop using the CHG and inform your nurse when you arrive at Short Stay.  Do not shave (including legs and underarms) for at least 48 hours prior to the first CHG shower.  You may shave your face.  Please follow these instructions carefully:   1.  Shower with CHG Soap the night  before surgery and the morning of Surgery.  2.  If you choose to wash your hair, wash your hair first as usual with your normal shampoo.  3.  After you shampoo, rinse your hair and body thoroughly to remove the Shampoo.  4.  Use CHG as you would any other liquid soap. You can apply chg directly to the skin and wash gently with scrungie or a clean washcloth.  5.  Apply the CHG Soap to your body ONLY FROM THE NECK DOWN.  Do not use on open wounds or open sores.  Avoid contact with your eyes, ears, mouth and genitals (private parts).  Wash genitals (private parts with your normal soap.  6.  Wash thoroughly, paying special attention to the area where your surgery will be performed.  7.  Thoroughly rinse your body with warm water from the neck down.  8.  DO NOT shower/wash with your normal soap after using and rinsing off the CHG Soap.  9.  Pat yourself dry with a clean towel.  10.  Wear clean pajamas.            11.  Place clean sheets on your bed the night of your first shower and do not sleep with pets.  Day of Surgery  Do not apply any lotions/deodorants the morning of surgery.  Please wear clean clothes to the hospital/surgery center.   Please read over the following fact sheets that you were given: Pain Booklet, Coughing and Deep Breathing, Blood Transfusion Information, MRSA Information and Surgical Site Infection Prevention

## 2013-09-14 ENCOUNTER — Encounter (HOSPITAL_COMMUNITY): Payer: Self-pay

## 2013-09-14 ENCOUNTER — Encounter (HOSPITAL_COMMUNITY)
Admission: RE | Admit: 2013-09-14 | Discharge: 2013-09-14 | Disposition: A | Discharge status: Home / Self Care | Payer: BC Managed Care – PPO | Source: Ambulatory Visit | Attending: Neurosurgery | Admitting: Neurosurgery

## 2013-09-14 ENCOUNTER — Ambulatory Visit (HOSPITAL_COMMUNITY)
Admission: RE | Admit: 2013-09-14 | Discharge: 2013-09-14 | Disposition: A | Discharge status: Home / Self Care | Payer: BC Managed Care – PPO | Source: Ambulatory Visit | Attending: Neurosurgery | Admitting: Neurosurgery

## 2013-09-14 DIAGNOSIS — Z01818 Encounter for other preprocedural examination: Secondary | ICD-10-CM | POA: Insufficient documentation

## 2013-09-14 DIAGNOSIS — Z01812 Encounter for preprocedural laboratory examination: Secondary | ICD-10-CM | POA: Insufficient documentation

## 2013-09-14 HISTORY — DX: Essential (primary) hypertension: I10

## 2013-09-14 LAB — CBC
HEMATOCRIT: 46.3 % (ref 39.0–52.0)
Hemoglobin: 16.4 g/dL (ref 13.0–17.0)
MCH: 33.2 pg (ref 26.0–34.0)
MCHC: 35.4 g/dL (ref 30.0–36.0)
MCV: 93.7 fL (ref 78.0–100.0)
PLATELETS: 266 10*3/uL (ref 150–400)
RBC: 4.94 MIL/uL (ref 4.22–5.81)
RDW: 14 % (ref 11.5–15.5)
WBC: 5.7 10*3/uL (ref 4.0–10.5)

## 2013-09-14 LAB — BASIC METABOLIC PANEL
BUN: 7 mg/dL (ref 6–23)
CALCIUM: 9.5 mg/dL (ref 8.4–10.5)
CHLORIDE: 98 meq/L (ref 96–112)
CO2: 28 mEq/L (ref 19–32)
CREATININE: 1.18 mg/dL (ref 0.50–1.35)
GFR calc Af Amer: 79 mL/min — ABNORMAL LOW (ref >90)
GFR calc non Af Amer: 68 mL/min — ABNORMAL LOW (ref >90)
Glucose, Bld: 87 mg/dL (ref 70–99)
Potassium: 4.2 mEq/L (ref 3.7–5.3)
Sodium: 138 mEq/L (ref 137–147)

## 2013-09-14 LAB — TYPE AND SCREEN
ABO/RH(D): O POS
Antibody Screen: NEGATIVE

## 2013-09-14 LAB — SURGICAL PCR SCREEN
MRSA, PCR: NEGATIVE
STAPHYLOCOCCUS AUREUS: NEGATIVE

## 2013-09-21 MED ORDER — CEFAZOLIN SODIUM-DEXTROSE 2-3 GM-% IV SOLR
2.0000 g | INTRAVENOUS | Status: AC
  Administered 2013-09-22 (×2): 2 g via INTRAVENOUS
  Filled 2013-09-21: qty 50

## 2013-09-21 NOTE — H&P (Signed)
Kevin Banks is an 53 y.o. male.   Chief Complaint: lumbar pain  HPI: patient who in the past had lumbar surgery followed by epidural injections with some relief of the pain. Lately the pain is getting worse up to the poin that he is working from home. He is taking oxycidone 30 , 5 time a day with improvement. He got a second opinion  Past Medical History  Diagnosis Date  . Hiatal hernia   . GERD (gastroesophageal reflux disease)   . Diverticulosis   . Hemorrhoids   . Diverticulitis   . Erosive esophagitis   . IBS (irritable bowel syndrome)   . Spinal disorder 01-12-13    spinal stenosis,DDD, bulging disc-  11-08-12 last steroid injection  . Hyperlipidemia   . History of kidney stones 01-12-13    past hx. and has some at present-not a problem  . PONV (postoperative nausea and vomiting)     Phnergan is the most effective for this,Zofran usually doesn't work  . Hypertension     dr Lendon Colonel    [place]    Past Surgical History  Procedure Laterality Date  . Back surgery    . Neck surgery      cervical fusion,anterior approach-Dr. Jeral Fruit  . Hand surgery Right     tendon repair  . Ankle reconstruction    . Tonsillectomy    . Appendectomy    . Lower back      dec 2011, feb 2012  . Colon surgery    . Ventral hernia repair N/A 01/27/2013    Procedure: LAPAROSCOPIC VENTRAL incisional HERNIA;  Surgeon: Mariella Saa, MD;  Location: [facility] ORS;  Service: General;  Laterality: N/A;  With Mesh  . Hernia repair  01/27/2013    umbilical hernia repair    Family History  Problem Relation Age of Onset  . Breast cancer Sister   . Cancer Sister   . Diabetes Father    Social History:  reports that he quit smoking about 8 months ago. His smoking use included Cigarettes. He smoked 0.25 packs per day. He has never used smokeless tobacco. He reports that he drinks alcohol. He reports that he does not use illicit drugs.  Allergies:  Allergies  Allergen Reactions  . Morphine And Related Itching     No prescriptions prior to admission    No results found for this or any previous visit (from the past 48 hour(s)). No results found.  Review of Systems  Constitutional: Negative.   HENT: Negative.   Eyes: Negative.   Respiratory: Negative.   Cardiovascular: Negative.   Gastrointestinal: Positive for constipation.  Genitourinary: Positive for urgency.  Musculoskeletal: Positive for back pain.  Skin: Negative.   Neurological: Positive for sensory change and focal weakness.  Endo/Heme/Allergies: Negative.   Psychiatric/Behavioral: Negative.     There were no vitals taken for this visit. Physical Exam hent, nl .  Neck, anterior scar. Cv, nl. Lungs clear. Abdomen,soft. Extremities, n. NEURO WEAKNESS OF df. Decrease of flexibility of the spine. Femoral maneuver positive. Lumbar myelogram showe l1-2 normal. l2-3 and 3-4 shows facet arthropathy. l4-5 and l5-s1  shoews ddd with a central disc and mobil;ity with flexion and extension.   Assessment/Plan Patient has failed with conservative treatment and oxycodone 30 mgs is not helping with the pai. He failed with conservative treatment at the pain clinic. i sat with him and his wife and at the end we agreed with decompression and fusion from l2 to s1. They know  the risks and benefits of the surgery. They know about csf leak as before, weakness , infection and the possibility on no improvement  Ritik Stavola M 09/21/2013, 8:15 PM

## 2013-09-22 ENCOUNTER — Encounter (HOSPITAL_COMMUNITY): Payer: Self-pay | Admitting: *Deleted

## 2013-09-22 ENCOUNTER — Inpatient Hospital Stay (HOSPITAL_COMMUNITY)
Admission: RE | Admit: 2013-09-22 | Discharge: 2013-09-28 | DRG: 460 | Disposition: A | Discharge status: Home / Self Care | Payer: BC Managed Care – PPO | Source: Ambulatory Visit | Attending: Neurosurgery | Admitting: Neurosurgery

## 2013-09-22 ENCOUNTER — Encounter (HOSPITAL_COMMUNITY)
Admission: RE | Disposition: A | Discharge status: Home / Self Care | Payer: BC Managed Care – PPO | Source: Ambulatory Visit | Attending: Neurosurgery

## 2013-09-22 ENCOUNTER — Inpatient Hospital Stay (HOSPITAL_COMMUNITY): Payer: BC Managed Care – PPO | Admitting: Anesthesiology

## 2013-09-22 ENCOUNTER — Inpatient Hospital Stay (HOSPITAL_COMMUNITY): Payer: BC Managed Care – PPO

## 2013-09-22 ENCOUNTER — Encounter (HOSPITAL_COMMUNITY): Payer: BC Managed Care – PPO | Admitting: Anesthesiology

## 2013-09-22 DIAGNOSIS — I959 Hypotension, unspecified: Secondary | ICD-10-CM | POA: Diagnosis not present

## 2013-09-22 DIAGNOSIS — M47817 Spondylosis without myelopathy or radiculopathy, lumbosacral region: Secondary | ICD-10-CM | POA: Diagnosis present

## 2013-09-22 DIAGNOSIS — M5126 Other intervertebral disc displacement, lumbar region: Secondary | ICD-10-CM | POA: Diagnosis present

## 2013-09-22 DIAGNOSIS — K279 Peptic ulcer, site unspecified, unspecified as acute or chronic, without hemorrhage or perforation: Secondary | ICD-10-CM | POA: Diagnosis present

## 2013-09-22 DIAGNOSIS — K449 Diaphragmatic hernia without obstruction or gangrene: Secondary | ICD-10-CM | POA: Diagnosis present

## 2013-09-22 DIAGNOSIS — K219 Gastro-esophageal reflux disease without esophagitis: Secondary | ICD-10-CM | POA: Diagnosis present

## 2013-09-22 DIAGNOSIS — K208 Other esophagitis without bleeding: Secondary | ICD-10-CM | POA: Diagnosis present

## 2013-09-22 DIAGNOSIS — Z981 Arthrodesis status: Secondary | ICD-10-CM

## 2013-09-22 DIAGNOSIS — K5900 Constipation, unspecified: Secondary | ICD-10-CM | POA: Diagnosis present

## 2013-09-22 DIAGNOSIS — M5136 Other intervertebral disc degeneration, lumbar region: Secondary | ICD-10-CM | POA: Diagnosis present

## 2013-09-22 DIAGNOSIS — Z87891 Personal history of nicotine dependence: Secondary | ICD-10-CM

## 2013-09-22 DIAGNOSIS — E785 Hyperlipidemia, unspecified: Secondary | ICD-10-CM | POA: Diagnosis present

## 2013-09-22 DIAGNOSIS — M51379 Other intervertebral disc degeneration, lumbosacral region without mention of lumbar back pain or lower extremity pain: Principal | ICD-10-CM | POA: Diagnosis present

## 2013-09-22 DIAGNOSIS — G709 Myoneural disorder, unspecified: Secondary | ICD-10-CM | POA: Diagnosis present

## 2013-09-22 DIAGNOSIS — I10 Essential (primary) hypertension: Secondary | ICD-10-CM | POA: Diagnosis present

## 2013-09-22 DIAGNOSIS — K589 Irritable bowel syndrome without diarrhea: Secondary | ICD-10-CM | POA: Diagnosis present

## 2013-09-22 DIAGNOSIS — Z888 Allergy status to other drugs, medicaments and biological substances status: Secondary | ICD-10-CM

## 2013-09-22 DIAGNOSIS — M5137 Other intervertebral disc degeneration, lumbosacral region: Principal | ICD-10-CM | POA: Diagnosis present

## 2013-09-22 HISTORY — PX: POSTERIOR LUMBAR FUSION 4 LEVEL: SHX6037

## 2013-09-22 LAB — URINALYSIS, ROUTINE W REFLEX MICROSCOPIC
Glucose, UA: NEGATIVE mg/dL
KETONES UR: 15 mg/dL — AB
NITRITE: NEGATIVE
PH: 5.5 (ref 5.0–8.0)
Protein, ur: 100 mg/dL — AB
Specific Gravity, Urine: 1.029 (ref 1.005–1.030)
Urobilinogen, UA: 0.2 mg/dL (ref 0.0–1.0)

## 2013-09-22 LAB — URINE MICROSCOPIC-ADD ON

## 2013-09-22 SURGERY — POSTERIOR LUMBAR FUSION 4 LEVEL
Anesthesia: General | Site: Back

## 2013-09-22 MED ORDER — SODIUM CHLORIDE 0.9 % IV SOLN
INTRAVENOUS | Status: DC | PRN
  Administered 2013-09-22: 11:00:00 via INTRAVENOUS

## 2013-09-22 MED ORDER — ONDANSETRON HCL 4 MG/2ML IJ SOLN
INTRAMUSCULAR | Status: DC | PRN
  Administered 2013-09-22 (×3): 4 mg via INTRAVENOUS

## 2013-09-22 MED ORDER — DIPHENHYDRAMINE HCL 50 MG/ML IJ SOLN: 12.5000 mg | Freq: Four times a day (QID) | INTRAMUSCULAR | Status: DC | PRN

## 2013-09-22 MED ORDER — PROMETHAZINE HCL 25 MG/ML IJ SOLN
INTRAMUSCULAR | Status: AC
  Filled 2013-09-22: qty 1

## 2013-09-22 MED ORDER — OXYCODONE-ACETAMINOPHEN 5-325 MG PO TABS
2.0000 | ORAL_TABLET | ORAL | Status: DC | PRN
  Administered 2013-09-22 – 2013-09-23 (×5): 2 via ORAL
  Filled 2013-09-22 (×6): qty 2

## 2013-09-22 MED ORDER — ROCURONIUM BROMIDE 100 MG/10ML IV SOLN
INTRAVENOUS | Status: DC | PRN
  Administered 2013-09-22 (×4): 10 mg via INTRAVENOUS
  Administered 2013-09-22: 50 mg via INTRAVENOUS
  Administered 2013-09-22 (×2): 5 mg via INTRAVENOUS

## 2013-09-22 MED ORDER — LACTATED RINGERS IV SOLN
INTRAVENOUS | Status: DC | PRN
  Administered 2013-09-22: 11:00:00 via INTRAVENOUS

## 2013-09-22 MED ORDER — SUCCINYLCHOLINE CHLORIDE 20 MG/ML IJ SOLN
INTRAMUSCULAR | Status: AC
  Filled 2013-09-22: qty 1

## 2013-09-22 MED ORDER — HYDROMORPHONE HCL PF 1 MG/ML IJ SOLN
0.2500 mg | INTRAMUSCULAR | Status: DC | PRN
  Administered 2013-09-22 (×4): 0.5 mg via INTRAVENOUS

## 2013-09-22 MED ORDER — ALBUMIN HUMAN 5 % IV SOLN
INTRAVENOUS | Status: DC | PRN
  Administered 2013-09-22: 13:00:00 via INTRAVENOUS

## 2013-09-22 MED ORDER — LIDOCAINE HCL (CARDIAC) 20 MG/ML IV SOLN
INTRAVENOUS | Status: AC
  Filled 2013-09-22: qty 5

## 2013-09-22 MED ORDER — SODIUM CHLORIDE 0.9 % IJ SOLN
3.0000 mL | Freq: Two times a day (BID) | INTRAMUSCULAR | Status: DC
  Administered 2013-09-22 – 2013-09-27 (×9): 3 mL via INTRAVENOUS

## 2013-09-22 MED ORDER — NALOXONE HCL 0.4 MG/ML IJ SOLN: 0.4000 mg | INTRAMUSCULAR | Status: DC | PRN

## 2013-09-22 MED ORDER — BUPIVACAINE LIPOSOME 1.3 % IJ SUSP
20.0000 mL | Freq: Once | INTRAMUSCULAR | Status: DC
  Filled 2013-09-22: qty 20

## 2013-09-22 MED ORDER — PHENYLEPHRINE HCL 10 MG/ML IJ SOLN
INTRAMUSCULAR | Status: DC | PRN
  Administered 2013-09-22 (×3): 40 ug via INTRAVENOUS
  Administered 2013-09-22 (×2): 80 ug via INTRAVENOUS
  Administered 2013-09-22: 40 ug via INTRAVENOUS
  Administered 2013-09-22: 80 ug via INTRAVENOUS

## 2013-09-22 MED ORDER — ACETAMINOPHEN 325 MG PO TABS
650.0000 mg | ORAL_TABLET | ORAL | Status: DC | PRN
  Administered 2013-09-24 – 2013-09-26 (×3): 650 mg via ORAL
  Filled 2013-09-22 (×3): qty 2

## 2013-09-22 MED ORDER — THROMBIN 20000 UNITS EX SOLR
CUTANEOUS | Status: DC | PRN
  Administered 2013-09-22 (×2): via TOPICAL

## 2013-09-22 MED ORDER — 0.9 % SODIUM CHLORIDE (POUR BTL) OPTIME
TOPICAL | Status: DC | PRN
  Administered 2013-09-22: 1000 mL

## 2013-09-22 MED ORDER — FENTANYL CITRATE 0.05 MG/ML IJ SOLN
INTRAMUSCULAR | Status: AC
  Filled 2013-09-22: qty 5

## 2013-09-22 MED ORDER — PROPOFOL 10 MG/ML IV BOLUS
INTRAVENOUS | Status: AC
Start: 2013-09-22 — End: 2013-09-22
  Filled 2013-09-22: qty 20

## 2013-09-22 MED ORDER — SODIUM CHLORIDE 0.9 % IV SOLN
INTRAVENOUS | Status: DC
  Administered 2013-09-22 – 2013-09-23 (×2): via INTRAVENOUS

## 2013-09-22 MED ORDER — LACTATED RINGERS IV SOLN
INTRAVENOUS | Status: DC | PRN
  Administered 2013-09-22 (×4): via INTRAVENOUS

## 2013-09-22 MED ORDER — SODIUM CHLORIDE 0.9 % IJ SOLN
INTRAMUSCULAR | Status: AC
  Administered 2013-09-22: 17:00:00
  Filled 2013-09-22: qty 9

## 2013-09-22 MED ORDER — LIDOCAINE HCL (CARDIAC) 20 MG/ML IV SOLN
INTRAVENOUS | Status: DC | PRN
  Administered 2013-09-22: 75 mg via INTRAVENOUS

## 2013-09-22 MED ORDER — ARTIFICIAL TEARS OP OINT
TOPICAL_OINTMENT | OPHTHALMIC | Status: DC | PRN
  Administered 2013-09-22: 1 via OPHTHALMIC

## 2013-09-22 MED ORDER — NEOSTIGMINE METHYLSULFATE 1 MG/ML IJ SOLN
INTRAMUSCULAR | Status: DC | PRN
  Administered 2013-09-22: 4 mg via INTRAVENOUS

## 2013-09-22 MED ORDER — GLYCOPYRROLATE 0.2 MG/ML IJ SOLN
INTRAMUSCULAR | Status: DC | PRN
  Administered 2013-09-22: .6 mg via INTRAVENOUS

## 2013-09-22 MED ORDER — HYDROMORPHONE HCL PF 1 MG/ML IJ SOLN
INTRAMUSCULAR | Status: AC
  Filled 2013-09-22: qty 2

## 2013-09-22 MED ORDER — ONDANSETRON HCL 4 MG/2ML IJ SOLN
INTRAMUSCULAR | Status: AC
  Filled 2013-09-22: qty 2

## 2013-09-22 MED ORDER — TAMSULOSIN HCL 0.4 MG PO CAPS
0.8000 mg | ORAL_CAPSULE | Freq: Every day | ORAL | Status: DC
  Administered 2013-09-22 – 2013-09-28 (×7): 0.8 mg via ORAL
  Filled 2013-09-22 (×7): qty 2

## 2013-09-22 MED ORDER — MIDAZOLAM HCL 5 MG/5ML IJ SOLN
INTRAMUSCULAR | Status: DC | PRN
  Administered 2013-09-22: 1 mg via INTRAVENOUS

## 2013-09-22 MED ORDER — ONDANSETRON HCL 4 MG/2ML IJ SOLN: 4.0000 mg | INTRAMUSCULAR | Status: DC | PRN

## 2013-09-22 MED ORDER — SODIUM CHLORIDE 0.9 % IJ SOLN: 3.0000 mL | INTRAMUSCULAR | Status: DC | PRN

## 2013-09-22 MED ORDER — SODIUM CHLORIDE 0.9 % IV SOLN
10.0000 mg | INTRAVENOUS | Status: DC | PRN
  Administered 2013-09-22: 10 ug/min via INTRAVENOUS

## 2013-09-22 MED ORDER — ONDANSETRON HCL 4 MG/2ML IJ SOLN
4.0000 mg | Freq: Once | INTRAMUSCULAR | Status: AC | PRN
  Administered 2013-09-22: 4 mg via INTRAVENOUS

## 2013-09-22 MED ORDER — CEFAZOLIN SODIUM 1-5 GM-% IV SOLN
1.0000 g | Freq: Three times a day (TID) | INTRAVENOUS | Status: AC
  Administered 2013-09-22 – 2013-09-23 (×2): 1 g via INTRAVENOUS
  Filled 2013-09-22 (×3): qty 50

## 2013-09-22 MED ORDER — SODIUM CHLORIDE 0.9 % IJ SOLN: 9.0000 mL | INTRAMUSCULAR | Status: DC | PRN

## 2013-09-22 MED ORDER — VECURONIUM BROMIDE 10 MG IV SOLR
INTRAVENOUS | Status: DC | PRN
  Administered 2013-09-22: 1 mg via INTRAVENOUS
  Administered 2013-09-22: 2 mg via INTRAVENOUS
  Administered 2013-09-22: 1 mg via INTRAVENOUS
  Administered 2013-09-22: 2 mg via INTRAVENOUS

## 2013-09-22 MED ORDER — ARTIFICIAL TEARS OP OINT
TOPICAL_OINTMENT | OPHTHALMIC | Status: AC
  Filled 2013-09-22: qty 3.5

## 2013-09-22 MED ORDER — SUCCINYLCHOLINE CHLORIDE 20 MG/ML IJ SOLN
INTRAMUSCULAR | Status: DC | PRN
  Administered 2013-09-22: 80 mg via INTRAVENOUS

## 2013-09-22 MED ORDER — PANTOPRAZOLE SODIUM 40 MG PO TBEC
40.0000 mg | DELAYED_RELEASE_TABLET | Freq: Every day | ORAL | Status: DC
  Administered 2013-09-22 – 2013-09-27 (×6): 40 mg via ORAL
  Filled 2013-09-22 (×6): qty 1

## 2013-09-22 MED ORDER — PROPOFOL 10 MG/ML IV BOLUS
INTRAVENOUS | Status: AC
  Filled 2013-09-22: qty 20

## 2013-09-22 MED ORDER — MENTHOL 3 MG MT LOZG: 1.0000 | LOZENGE | OROMUCOSAL | Status: DC | PRN

## 2013-09-22 MED ORDER — DIAZEPAM 5 MG PO TABS
5.0000 mg | ORAL_TABLET | Freq: Four times a day (QID) | ORAL | Status: DC | PRN
  Administered 2013-09-24 – 2013-09-28 (×6): 5 mg via ORAL
  Filled 2013-09-22 (×8): qty 1

## 2013-09-22 MED ORDER — PHENOL 1.4 % MT LIQD: 1.0000 | OROMUCOSAL | Status: DC | PRN

## 2013-09-22 MED ORDER — ONDANSETRON HCL 4 MG/2ML IJ SOLN: 4.0000 mg | Freq: Four times a day (QID) | INTRAMUSCULAR | Status: DC | PRN

## 2013-09-22 MED ORDER — NEOSTIGMINE METHYLSULFATE 1 MG/ML IJ SOLN
INTRAMUSCULAR | Status: AC
  Filled 2013-09-22: qty 10

## 2013-09-22 MED ORDER — MIDAZOLAM HCL 2 MG/2ML IJ SOLN
INTRAMUSCULAR | Status: AC
  Filled 2013-09-22: qty 2

## 2013-09-22 MED ORDER — GLYCOPYRROLATE 0.2 MG/ML IJ SOLN
INTRAMUSCULAR | Status: AC
  Filled 2013-09-22: qty 3

## 2013-09-22 MED ORDER — PROMETHAZINE HCL 25 MG/ML IJ SOLN
12.5000 mg | Freq: Once | INTRAMUSCULAR | Status: AC
  Administered 2013-09-22: 12.5 mg via INTRAVENOUS

## 2013-09-22 MED ORDER — ROCURONIUM BROMIDE 50 MG/5ML IV SOLN
INTRAVENOUS | Status: AC
  Filled 2013-09-22: qty 2

## 2013-09-22 MED ORDER — ACETAMINOPHEN 650 MG RE SUPP: 650.0000 mg | RECTAL | Status: DC | PRN

## 2013-09-22 MED ORDER — FENTANYL CITRATE 0.05 MG/ML IJ SOLN
INTRAMUSCULAR | Status: DC | PRN
  Administered 2013-09-22: 100 ug via INTRAVENOUS
  Administered 2013-09-22 (×3): 50 ug via INTRAVENOUS
  Administered 2013-09-22: 100 ug via INTRAVENOUS
  Administered 2013-09-22: 50 ug via INTRAVENOUS
  Administered 2013-09-22: 25 ug via INTRAVENOUS
  Administered 2013-09-22 (×4): 50 ug via INTRAVENOUS
  Administered 2013-09-22 (×2): 100 ug via INTRAVENOUS
  Administered 2013-09-22: 50 ug via INTRAVENOUS
  Administered 2013-09-22: 25 ug via INTRAVENOUS

## 2013-09-22 MED ORDER — BUPIVACAINE LIPOSOME 1.3 % IJ SUSP
INTRAMUSCULAR | Status: DC | PRN
  Administered 2013-09-22: 20 mL

## 2013-09-22 MED ORDER — FENTANYL 10 MCG/ML IV SOLN
INTRAVENOUS | Status: DC
  Administered 2013-09-22: 17:00:00 via INTRAVENOUS
  Administered 2013-09-22: 240 ug via INTRAVENOUS
  Administered 2013-09-23: 145.2 ug via INTRAVENOUS
  Administered 2013-09-23: 105 ug via INTRAVENOUS
  Administered 2013-09-23: 210 ug via INTRAVENOUS
  Administered 2013-09-23 (×2): via INTRAVENOUS
  Administered 2013-09-23: 10 ug via INTRAVENOUS
  Administered 2013-09-23: 195 ug via INTRAVENOUS
  Administered 2013-09-24: 375 ug via INTRAVENOUS
  Administered 2013-09-24: 05:00:00 via INTRAVENOUS
  Filled 2013-09-22 (×6): qty 50

## 2013-09-22 MED ORDER — VECURONIUM BROMIDE 10 MG IV SOLR
INTRAVENOUS | Status: AC
  Filled 2013-09-22: qty 10

## 2013-09-22 MED ORDER — ZOLPIDEM TARTRATE 5 MG PO TABS
10.0000 mg | ORAL_TABLET | Freq: Every evening | ORAL | Status: DC | PRN
  Administered 2013-09-24: 10 mg via ORAL
  Filled 2013-09-22: qty 2

## 2013-09-22 MED ORDER — SODIUM CHLORIDE 0.9 % IV SOLN: 250.0000 mL | INTRAVENOUS | Status: DC

## 2013-09-22 MED ORDER — DIPHENHYDRAMINE HCL 12.5 MG/5ML PO ELIX: 12.5000 mg | ORAL_SOLUTION | Freq: Four times a day (QID) | ORAL | Status: DC | PRN

## 2013-09-22 MED ORDER — PROPOFOL 10 MG/ML IV BOLUS
INTRAVENOUS | Status: DC | PRN
  Administered 2013-09-22: 270 mg via INTRAVENOUS

## 2013-09-22 MED ORDER — ONDANSETRON HCL 4 MG/2ML IJ SOLN
INTRAMUSCULAR | Status: AC
  Filled 2013-09-22: qty 6

## 2013-09-22 MED ORDER — PROMETHAZINE HCL 25 MG/ML IJ SOLN
INTRAMUSCULAR | Status: DC | PRN
  Administered 2013-09-22: 12.5 mg via INTRAVENOUS

## 2013-09-22 SURGICAL SUPPLY — 81 items
BENZOIN TINCTURE PRP APPL 2/3 (GAUZE/BANDAGES/DRESSINGS) ×3 IMPLANT
BLADE SURG ROTATE 9660 (MISCELLANEOUS) IMPLANT
BUR ACORN 6.0 (BURR) ×2 IMPLANT
BUR ACORN 6.0MM (BURR) ×1
BUR MATCHSTICK NEURO 3.0 LAGG (BURR) ×6 IMPLANT
CANISTER SUCT 3000ML (MISCELLANEOUS) ×3 IMPLANT
CAP REVERE LOCKING (Cap) ×30 IMPLANT
CLOSURE WOUND 1/2 X4 (GAUZE/BANDAGES/DRESSINGS) ×1
CONN CROSSLINK REV 6.35 48-60 (Connector) ×3 IMPLANT
CONNECTOR CRSLNK REV6.35 48-60 (Connector) ×1 IMPLANT
CONT SPEC 4OZ CLIKSEAL STRL BL (MISCELLANEOUS) ×3 IMPLANT
COVER BACK TABLE 24X17X13 BIG (DRAPES) IMPLANT
COVER TABLE BACK 60X90 (DRAPES) ×3 IMPLANT
DRAPE C-ARM 42X72 X-RAY (DRAPES) ×6 IMPLANT
DRAPE LAPAROTOMY 100X72X124 (DRAPES) ×3 IMPLANT
DRAPE MICROSCOPE LEICA (MISCELLANEOUS) ×3 IMPLANT
DRAPE POUCH INSTRU U-SHP 10X18 (DRAPES) ×3 IMPLANT
DURAPREP 26ML APPLICATOR (WOUND CARE) ×3 IMPLANT
ELECT REM PT RETURN 9FT ADLT (ELECTROSURGICAL) ×3
ELECTRODE REM PT RTRN 9FT ADLT (ELECTROSURGICAL) ×1 IMPLANT
EVACUATOR 1/8 PVC DRAIN (DRAIN) IMPLANT
EVACUATOR 3/16  PVC DRAIN (DRAIN) ×2
EVACUATOR 3/16 PVC DRAIN (DRAIN) ×1 IMPLANT
GAUZE SPONGE 4X4 16PLY XRAY LF (GAUZE/BANDAGES/DRESSINGS) ×9 IMPLANT
GLOVE BIOGEL M 8.0 STRL (GLOVE) ×3 IMPLANT
GLOVE BIOGEL PI IND STRL 7.5 (GLOVE) ×1 IMPLANT
GLOVE BIOGEL PI INDICATOR 7.5 (GLOVE) ×2
GLOVE ECLIPSE 7.0 STRL STRAW (GLOVE) ×3 IMPLANT
GLOVE EXAM NITRILE LRG STRL (GLOVE) IMPLANT
GLOVE EXAM NITRILE MD LF STRL (GLOVE) IMPLANT
GLOVE EXAM NITRILE XL STR (GLOVE) IMPLANT
GLOVE EXAM NITRILE XS STR PU (GLOVE) IMPLANT
GOWN BRE IMP SLV AUR LG STRL (GOWN DISPOSABLE) IMPLANT
GOWN BRE IMP SLV AUR XL STRL (GOWN DISPOSABLE) IMPLANT
GOWN STRL REIN 2XL LVL4 (GOWN DISPOSABLE) IMPLANT
GOWN STRL REUS W/ TWL LRG LVL3 (GOWN DISPOSABLE) ×1 IMPLANT
GOWN STRL REUS W/ TWL XL LVL3 (GOWN DISPOSABLE) ×3 IMPLANT
GOWN STRL REUS W/TWL LRG LVL3 (GOWN DISPOSABLE) ×2
GOWN STRL REUS W/TWL XL LVL3 (GOWN DISPOSABLE) ×6
KIT BASIN OR (CUSTOM PROCEDURE TRAY) ×3 IMPLANT
KIT INFUSE LRG II (Orthopedic Implant) ×3 IMPLANT
KIT ROOM TURNOVER OR (KITS) ×3 IMPLANT
MILL MEDIUM DISP (BLADE) ×3 IMPLANT
NEEDLE HYPO 18GX1.5 BLUNT FILL (NEEDLE) IMPLANT
NEEDLE HYPO 21X1.5 SAFETY (NEEDLE) ×3 IMPLANT
NEEDLE HYPO 25X1 1.5 SAFETY (NEEDLE) IMPLANT
NS IRRIG 1000ML POUR BTL (IV SOLUTION) ×3 IMPLANT
PACK LAMINECTOMY NEURO (CUSTOM PROCEDURE TRAY) ×3 IMPLANT
PAD ABD 8X10 STRL (GAUZE/BANDAGES/DRESSINGS) IMPLANT
PAD ARMBOARD 7.5X6 YLW CONV (MISCELLANEOUS) ×9 IMPLANT
PATTIES SURGICAL .5 X1 (DISPOSABLE) ×3 IMPLANT
PATTIES SURGICAL .5 X3 (DISPOSABLE) IMPLANT
PATTIES SURGICAL 1X1 (DISPOSABLE) ×9 IMPLANT
ROD SPINE HEX 6.35X200 (Rod) ×6 IMPLANT
RUBBERBAND STERILE (MISCELLANEOUS) ×6 IMPLANT
SCREW REVERE 6.35 6.5MMX45 (Screw) ×24 IMPLANT
SCREW REVERE 6.35 6.5X55MM (Screw) ×6 IMPLANT
SLEEVE SURGEON STRL (DRAPES) ×3 IMPLANT
SPACER SUSTAIN O 10X10X26 (Screw) ×6 IMPLANT
SPACER SUSTAIN O 10X26 12MM (Spacer) ×6 IMPLANT
SPACER SUSTAIN O SM 8X22 10M (Spacer) ×6 IMPLANT
SPONGE GAUZE 4X4 12PLY (GAUZE/BANDAGES/DRESSINGS) ×3 IMPLANT
SPONGE LAP 4X18 X RAY DECT (DISPOSABLE) IMPLANT
SPONGE NEURO XRAY DETECT 1X3 (DISPOSABLE) IMPLANT
SPONGE SURGIFOAM ABS GEL 100 (HEMOSTASIS) ×6 IMPLANT
STRIP CLOSURE SKIN 1/2X4 (GAUZE/BANDAGES/DRESSINGS) ×2 IMPLANT
SUT ETHILON 3 0 PS 1 (SUTURE) ×6 IMPLANT
SUT PROLENE 6 0 BV (SUTURE) ×6 IMPLANT
SUT VIC AB 1 CT1 18XBRD ANBCTR (SUTURE) ×3 IMPLANT
SUT VIC AB 1 CT1 8-18 (SUTURE) ×6
SUT VIC AB 2-0 CP2 18 (SUTURE) ×6 IMPLANT
SUT VIC AB 3-0 SH 8-18 (SUTURE) ×6 IMPLANT
SYR 20CC LL (SYRINGE) ×3 IMPLANT
SYR 20ML ECCENTRIC (SYRINGE) ×3 IMPLANT
SYR 5ML LL (SYRINGE) IMPLANT
TAPE CLOTH SURG 4X10 WHT LF (GAUZE/BANDAGES/DRESSINGS) ×3 IMPLANT
TOWEL OR 17X24 6PK STRL BLUE (TOWEL DISPOSABLE) ×3 IMPLANT
TOWEL OR 17X26 10 PK STRL BLUE (TOWEL DISPOSABLE) ×3 IMPLANT
TRAY FOLEY CATH 14FRSI W/METER (CATHETERS) IMPLANT
TRAY FOLEY CATH 16FRSI W/METER (SET/KITS/TRAYS/PACK) ×3 IMPLANT
WATER STERILE IRR 1000ML POUR (IV SOLUTION) ×3 IMPLANT

## 2013-09-22 NOTE — Anesthesia Preprocedure Evaluation (Signed)
Anesthesia Evaluation  Patient identified by MRN, date of birth, ID band Patient awake    Reviewed: Allergy & Precautions, H&P , NPO status , Patient's Chart, lab work & pertinent test results  Airway       Dental   Pulmonary former smoker,          Cardiovascular hypertension,     Neuro/Psych  Neuromuscular disease    GI/Hepatic hiatal hernia, PUD, GERD-  ,  Endo/Other    Renal/GU      Musculoskeletal   Abdominal   Peds  Hematology   Anesthesia Other Findings   Reproductive/Obstetrics                           Anesthesia Physical Anesthesia Plan  ASA: I  Anesthesia Plan: General   Post-op Pain Management:    Induction: Intravenous  Airway Management Planned: Oral ETT  Additional Equipment:   Intra-op Plan:   Post-operative Plan: Extubation in OR  Informed Consent: I have reviewed the patients History and Physical, chart, labs and discussed the procedure including the risks, benefits and alternatives for the proposed anesthesia with the patient or authorized representative who has indicated his/her understanding and acceptance.     Plan Discussed with:   Anesthesia Plan Comments:         Anesthesia Quick Evaluation

## 2013-09-22 NOTE — Progress Notes (Signed)
Updated Dr. Katrinka BlazingSmith with anesthesiology on urine color- that is now a tea color. Was told in report that the urine changed from amber/dark yellow to slightly pink. [facility] urine color was a tea color. No new orders received, VSS. Pain is patient's only complaint

## 2013-09-22 NOTE — Anesthesia Postprocedure Evaluation (Signed)
  Anesthesia Post-op Note  Patient: Kevin KennedyDaniel J Banks  Procedure(s) Performed: Procedure(s) with comments: POSTERIOR LUMBAR INTERBODY FUSION LUMBAR TWO-THREE,LUMBAR THREE-FOUR,LUMBAR FOUR-FIVE LUMBAR FIVE SACRAL-ONE,CAGES PEDICLE SCREWS POSTERIOR LATERAL ARTHRODESIS (N/A) - POSTERIOR LUMBAR INTERBODY FUSION LUMBAR TWO-THREE,LUMBAR THREE-FOUR,LUMBAR FOUR-FIVE LUMBAR FIVE SACRAL-ONE,CAGES PEDICLE SCREWS POSTERIOR LATERAL ARTHRODESIS  Patient Location: PACU  Anesthesia Type:General  Level of Consciousness: awake, oriented, sedated and patient cooperative  Airway and Oxygen Therapy: Patient Spontanous Breathing  Post-op Pain: moderate  Post-op Assessment: Post-op Vital signs reviewed, Patient's Cardiovascular Status Stable, Respiratory Function Stable, Patent Airway, No signs of Nausea or vomiting and Pain level controlled  Post-op Vital Signs: stable  Complications: No apparent anesthesia complications

## 2013-09-22 NOTE — Transfer of Care (Signed)
Immediate Anesthesia Transfer of Care Note  Patient: Kevin Banks  Procedure(s) Performed: Procedure(s) with comments: POSTERIOR LUMBAR INTERBODY FUSION LUMBAR TWO-THREE,LUMBAR THREE-FOUR,LUMBAR FOUR-FIVE LUMBAR FIVE SACRAL-ONE,CAGES PEDICLE SCREWS POSTERIOR LATERAL ARTHRODESIS (N/A) - POSTERIOR LUMBAR INTERBODY FUSION LUMBAR TWO-THREE,LUMBAR THREE-FOUR,LUMBAR FOUR-FIVE LUMBAR FIVE SACRAL-ONE,CAGES PEDICLE SCREWS POSTERIOR LATERAL ARTHRODESIS  Patient Location: PACU  Anesthesia Type:General  Level of Consciousness: awake and alert   Airway & Oxygen Therapy: Patient Spontanous Breathing and Patient connected to face mask oxygen  Post-op Assessment: Report given to PACU RN and Post -op Vital signs reviewed and stable  Post vital signs: Reviewed and stable  Complications: No apparent anesthesia complications; patient with area of pressure (slightly blanchable) above eyebrows.

## 2013-09-22 NOTE — Preoperative (Signed)
Beta Blockers   Reason not to administer Beta Blockers:Not Applicable 

## 2013-09-23 MED ORDER — SENNOSIDES-DOCUSATE SODIUM 8.6-50 MG PO TABS
1.0000 | ORAL_TABLET | Freq: Two times a day (BID) | ORAL | Status: DC
  Administered 2013-09-23 – 2013-09-28 (×7): 1 via ORAL
  Filled 2013-09-23 (×8): qty 1

## 2013-09-23 MED ORDER — SODIUM CHLORIDE 0.9 % IV BOLUS (SEPSIS)
500.0000 mL | Freq: Once | INTRAVENOUS | Status: AC
  Administered 2013-09-23: 500 mL via INTRAVENOUS

## 2013-09-23 MED ORDER — BISACODYL 10 MG RE SUPP
10.0000 mg | Freq: Every day | RECTAL | Status: DC | PRN
  Filled 2013-09-23: qty 1

## 2013-09-23 NOTE — Progress Notes (Signed)
MD called back with new orders 

## 2013-09-23 NOTE — Op Note (Signed)
NAMEMarland Kitchen  HORICE, CARRERO NO.:  [id number]  MEDICAL RECORD NO.:  [id number]  LOCATION:  [id number]                        FACILITY:  [facility]  PHYSICIAN:  Hilda Lias, M.D.   DATE OF BIRTH:  12-25-1960  DATE OF PROCEDURE:  09/22/2013 DATE OF DISCHARGE:                              OPERATIVE REPORT   PREOPERATIVE DIAGNOSIS:  Degenerative disk disease, lumbar spine at the level of L2-3, L3-4, L4-5, L5-S1.  Chronic radiculopathy, status post 3 lumbar procedures.  POSTOPERATIVE DIAGNOSIS:  Degenerative disk disease, lumbar spine at the level of L2-3, L3-4, L4-5, L5-S1.  Chronic radiculopathy, status post 3 lumbar procedure.  PROCEDURE:  Bilateral L2, L3, L4, and L5 laminectomy, bilateral facetectomies at those 4 levels.  Lysis of adhesions.  Decompression of the L2, L3, L4, L5, and S1 nerve root bilaterally.  Bilaterally diskectomy at the level of L2-3, L3-4, L4-5, L5-S1.  Interbody fusion with cages of 10 x 26 at the level of L2-3; at the level of L3-4, it was 12 x 26; at the level of L4-L5, it was 10 x 22.  Lysis of adhesion and decompression of the L5-S1 area.  No cages were used at the level of L5- S1.  Insertion of pedicle screws at the level of L2, L3, L4, L5, and S1 bilaterally.  Posterolateral arthrodesis with autograft and BMP. Microscope.  SURGEON:  Hilda Lias, M.D.  ASSISTANT:  Dr. Conchita Paris.  CLINICAL HISTORY:  Kevin Banks is a gentleman who in the past had 3 lumbar intervention for pain going to the right leg and lately to the left leg.  The patient had been seen by the [facility].  He has _had a second opinion_________ in the area.  The patient had been taking oxycodone 30 mg every 4 hours with no relief of the pain.  Lately, the patient had been unable to work.  He had been staying at home, gaining weight with more increase of the pain.  The outpatient myelogram was obtained which showed degenerative disk disease at the level of L2-3 all the way  down to L5-S1.  I did disagree with the way the myelogram was read and the patient knew about.  The procedure was fully explained to them.  They declined __more________ opinion.  The risks of course were no improvement whatsoever, CSF leak like in the past, infection, hematoma, need for further surgery.  DESCRIPTION OF PROCEDURE:  The patient was taken to the OR, and after intubation, he was positioned in a prone manner.  The back was cleaned with Betadine first and later on with DuraPrep.  Drapes were applied. Incision resecting the previous scar was made all the way down to the L2 to L5-S1.  Muscle was retracted laterally.  The patient had quite a bit of adhesion and at the end, we were able to go all the way laterally to be able to see the lateral aspect of the facet.  Then, with the Leksell, we removed the spinous process of 2, 3, 4, 5 as well as of the lamina. The most difficult part was taking the lamina at the level of 2-3 and 4- 5 where there was quite a bit of adhesion with quite a bit  of calcification on top of the dura mater.  At the end, we were able to decompress the thecal sac.  From then on, we investigated the area of L5- S1 and we knew by x-ray, then the space was quite narrow and it was difficult to get into the disk space.  Nevertheless, we were able to decompress the thecal sac as well as the L5-S1 nerve root.  At the level L4-5, we encountered the same amount of fibrosis but we were able to decompress the L4 and L5 nerve roots, and get into the disk space where total diskectomy was achieved.  The endplates were removed and 2 cages of 10 x 22 were inserted.  The cages had autograft as well as BMP. Diskectomy was done more than normal all the way laterally to be able to introduce the cages.  At the level of 2-3 and 3-4, we found quite a bit of adhesion, the worst was at the level of 2-3 to the right side.  We entered the disk space at those 2 levels and again  diskectomy was achieved.  At the level of 2-3, the cages were 10 x 26 and at the level of 3-4 were 12 x 26.  BMP and autograft were used for the interbody arthrodesis.  Then, at the level of L2-3 on the right side, we went laterally, we will do extraforaminal decompression of the L2-L3 nerve root where previously had surgery.  Then, using the C-arm first in AP view and then a lateral view, we were able to get into the pedicle of L2, L3, L4, L5, and S1.  Holes were made all the way into the vertebral body.  There was plenty of space as well as the holes were surrounded by bone.  Then, 8 screws of 6.5 x 45 were inserted at the level L2, L3, L4, L5, and 2 screws of 6.5 x 55 were at the level of the S1.  The screws were connected with the rod and kept in place with Capps.  Then, a cross- link from right to left was done.  From then on, we went laterally and we removed the periosteum from L2 to S1 and a mix of BMP and autograft were used for arthrodesis.  In the midline, we had subarachnoid pouch. With the help of the microscope, we were able to close without any problem.  Valsalva was done 3 times up to 50 was negative.  From then on, the area was irrigated.  Drain was left in the epidural space, and the wound was closed with Vicryl and nylon.  The patient had lost about 1.5 L and 750 mL were given back using the Cell Saver.          ______________________________ Hilda LiasErnesto Euna Banks, M.D.     EB/MEDQ  D:  09/22/2013  T:  09/23/2013  Job:  [id number]

## 2013-09-23 NOTE — Progress Notes (Signed)
NT reported to writer that patient's BP is 74/49, MD called to notify.

## 2013-09-23 NOTE — Progress Notes (Signed)
Patient ID: Kevin KennedyDaniel J Banks, male   DOB: 1960/09/18, [age] y.o.   MRN: [id number] Afeb, vss No new neuro issues. Says he is starting to feel a bit better, but has a lot of pain c/w the 4 level plif. Will start to increase activity when he feels ready.

## 2013-09-23 NOTE — Evaluation (Signed)
Occupational Therapy Evaluation Patient Details Name: SUJAY GRUNDMAN MRN: [id number] DOB: 21-Feb-1961 Today's Date: 09/23/2013    History of Present Illness Bilateral L2, L3, L4, and L5 laminectomy, bilateral facetectomies at those 4 levels. Lysis of adhesions. Decompression of the L2, L3, L4, L5, and S1 nerve root bilaterally. Bilaterally diskectomy at the level of L2-3, L3-4, L4-5, L5-S1. Interbody fusion with cages of 10 x 26 at the level of L2-3; at the level of L3-4, it was 12 x 26; at the level of L4-L5, it was 10 x 22. Lysis of adhesion and decompression of the L5-S1 area. No cages were used at the level of L5- S1. Insertion of pedicle screws at the level of L2, L3, L4, L5, and S1 bilaterally. Posterolateral arthrodesis with autograft and BMP   Clinical Impression   This 53 yo admitted and underwent above presents to acute OT with increased pain, decreased mobility, back precautions, need to wear back brace when up all affecting pt's ability to care for himself, will benefit from acute OT without need for follow up at this time.    Follow Up Recommendations  No OT follow up    Equipment Recommendations  3 in 1 bedside comode (as shower seat)       Precautions / Restrictions Precautions Precautions: Back Precaution Comments: Patient with low BP and in chair as PT entered room. Required Braces or Orthoses: Spinal Brace Spinal Brace: Lumbar corset Restrictions Weight Bearing Restrictions: No      Mobility Bed Mobility Overal bed mobility: Needs Assistance Bed Mobility: Rolling;Sidelying to Sit Rolling: Min guard (increased time) Sidelying to sit: Min guard (increased time)     General bed mobility comments: VCs for sequencing and technique  Transfers Overall transfer level: Needs assistance Equipment used: Rolling walker (2 wheeled) Transfers: Sit to/from Stand Sit to Stand: Min assist           ADL Eating/Feeding: Independent;Sitting Grooming: Set  up;Supervision/safety;Sitting   Upper Body Dressing : Moderate assistance;Sitting Lower Body Bathing: Total assistance;Sit to/from stand Lower Body Dressing: Total assistance;Sit to/from stand Toilet Transfer: Minimal assistance;Ambulation (Bed>5 feet>sit in recliner behind him) Toileting- Architect and Hygiene: Total assistance;Sit to/from stand   Functional mobility during ADLs: Minimal assistance;Rolling walker (gait belt) General ADL Comments: total A to don brace due to pain--aware of how to do it since he had it prior and was using it prior to surgery               Pertinent Vitals/Pain 10/10 back with movement; RN made aware and brought in pain meds and working on getting more meds for PCA pump., repositioned out of bed and in chair.     Hand Dominance Right   Extremity/Trunk Assessment Upper Extremity Assessment Upper Extremity Assessment: Overall WFL for tasks assessed      Communication Communication Communication: No difficulties   Cognition Arousal/Alertness: Awake/alert Behavior During Therapy: WFL for tasks assessed/performed Overall Cognitive Status: Within Functional Limits for tasks assessed                             Home Living Family/patient expects to be discharged to:: Private residence Living Arrangements: Spouse/significant other Available Help at Discharge: Available 24 hours/day;Family (for a few days) Type of Home: House Home Access: Level entry     Home Layout: One level     Bathroom Shower/Tub: Walk-in Pensions consultant: Standard     Home Equipment: Toilet riser;Hand  held shower head          Prior Functioning/Environment Level of Independence: Independent                OT Problem List: Decreased strength;Decreased activity tolerance;Pain;Impaired balance (sitting and/or standing);Decreased knowledge of precautions;Decreased knowledge of use of DME or AE   OT  Treatment/Interventions: Self-care/ADL training;Patient/family education;Balance training;DME and/or AE instruction;Therapeutic activities    OT Goals(Current goals can be found in the care plan section) Acute Rehab OT Goals Patient Stated Goal: To decrease pain OT Goal Formulation: With patient Time For Goal Achievement: 09/30/13 Potential to Achieve Goals: Good  OT Frequency: Min 3X/week           End of Session: Equipment Utilized During Treatment: Gait belt;Rolling walker;Back brace  Activity Tolerance: Patient limited by pain Patient left: in chair;with call bell/phone within reach;with family/visitor present   Time: [id number] OT Time Calculation (min): 42 min Charges:  OT General Charges $OT Visit: 1 Procedure OT Evaluation $Initial OT Evaluation Tier I: 1 Procedure OT Treatments $Self Care/Home Management : 23-37 mins  Evette GeorgesLeonard, Araina Butrick Eva [phone] 09/23/2013, 4:52 PM

## 2013-09-23 NOTE — Evaluation (Signed)
Physical Therapy Evaluation Patient Details Name: Kevin Banks MRN: [id number] DOB: [birth year] Today's Date: 09/23/2013   History of Present Illness  Bilateral L2, L3, L4, and L5 laminectomy, bilateral facetectomies at those 4 levels. Lysis of adhesions. Decompression of the L2, L3, L4, L5, and S1 nerve root bilaterally. Bilaterally diskectomy at the level of L2-3, L3-4, L4-5, L5-S1. Interbody fusion with cages of 10 x 26 at the level of L2-3; at the level of L3-4, it was 12 x 26; at the level of L4-L5, it was 10 x 22. Lysis of adhesion and decompression of the L5-S1 area. No cages were used at the level of L5- S1. Insertion of pedicle screws at the level of L2, L3, L4, L5, and S1 bilaterally. Posterolateral arthrodesis with autograft and BMP  Clinical Impression  Patient presents with problems listed below.  Will benefit from acute PT to maximize independence prior to discharge home with wife.  Session today limited by pain and decreased BP.     Follow Up Recommendations Home health PT;Supervision/Assistance - 24 hour    Equipment Recommendations  Rolling walker with 5" wheels    Recommendations for Other Services       Precautions / Restrictions Precautions Precautions: Back Precaution Comments: Patient with low BP and in chair as PT entered room. Required Braces or Orthoses: Spinal Brace Spinal Brace: Lumbar corset Restrictions Weight Bearing Restrictions: No      Mobility  Bed Mobility Overal bed mobility: Needs Assistance;+2 for physical assistance Bed Mobility: Sit to Sidelying         Sit to sidelying: Mod assist;+2 for physical assistance General bed mobility comments: Verbal cues for technique.  Assist to lower trunk to bed, and assist to bring LE's onto bed.  Transfers Overall transfer level: Needs assistance Equipment used: Rolling walker (2 wheeled) Transfers: Sit to/from [employer] Sit to Stand: Mod assist Stand pivot transfers: Min  assist       General transfer comment: Verbal cues for hand placement and technique.  Assist to rise from low chair and for balance initially.  Patient able to take several steps to pivot to chair with min assist.  Ambulation/Gait                Stairs            Wheelchair Mobility    Modified Rankin (Stroke Patients Only)       Balance                                     Pertinent Vitals/Pain Pain 10/10 and hypotensive - limiting mobility    Home Living Family/patient expects to be discharged to:: Private residence Living Arrangements: Spouse/significant other Available Help at Discharge: Available 24 hours/day;Family (for a few days) Type of Home: House Home Access: Level entry     Home Layout: One level Home Equipment: Toilet riser;Hand held shower head      Prior Function Level of Independence: Independent               Hand Dominance   Dominant Hand: Right    Extremity/Trunk Assessment   Upper Extremity Assessment: Overall WFL for tasks assessed           Lower Extremity Assessment: Generalized weakness         Communication   Communication: No difficulties  Cognition Arousal/Alertness: Awake/alert Behavior During Therapy: WFL for tasks assessed/performed Overall  Cognitive Status: Within Functional Limits for tasks assessed                      General Comments      Exercises        Assessment/Plan    PT Assessment Patient needs continued PT services  PT Diagnosis Difficulty walking;Acute pain   PT Problem List Decreased strength;Decreased activity tolerance;Decreased balance;Decreased mobility;Decreased knowledge of use of DME;Decreased knowledge of precautions;Pain  PT Treatment Interventions DME instruction;Gait training;Functional mobility training;Patient/family education   PT Goals (Current goals can be found in the Care Plan section) Acute Rehab PT Goals Patient Stated Goal: To  decrease pain PT Goal Formulation: With patient/family Time For Goal Achievement: 09/30/13 Potential to Achieve Goals: Good    Frequency 7X/week   Barriers to discharge        End of Session Equipment Utilized During Treatment: Gait belt;Back brace;Oxygen Activity Tolerance: Patient limited by pain;Patient limited by fatigue;Treatment limited secondary to medical complications (Comment) (Patient hypotensive sitting in chair, limiting mobility) Patient left: in bed;with call bell/phone within reach;with family/visitor present         Time: [id number] PT Time Calculation (min): 18 min   Charges:   PT Evaluation $Initial PT Evaluation Tier I: 1 Procedure PT Treatments $Therapeutic Activity: 8-22 mins   PT G Codes:          Vena AustriaDavis, Hason Ofarrell H 09/23/2013, 3:24 PM Durenda HurtSusan H. Renaldo Fiddleravis, PT, [facility] Acute Rehab Services Pager [phone]

## 2013-09-24 MED ORDER — OXYCODONE HCL ER 15 MG PO T12A
15.0000 mg | EXTENDED_RELEASE_TABLET | Freq: Two times a day (BID) | ORAL | Status: DC
  Administered 2013-09-24 – 2013-09-28 (×8): 15 mg via ORAL
  Filled 2013-09-24 (×8): qty 1

## 2013-09-24 MED ORDER — OXYCODONE HCL 5 MG PO TABS
5.0000 mg | ORAL_TABLET | ORAL | Status: DC | PRN
  Administered 2013-09-24 – 2013-09-25 (×3): 5 mg via ORAL
  Filled 2013-09-24 (×3): qty 1

## 2013-09-24 MED ORDER — OXYCODONE-ACETAMINOPHEN 5-325 MG PO TABS
1.0000 | ORAL_TABLET | ORAL | Status: DC | PRN
  Administered 2013-09-24 (×2): 1 via ORAL
  Administered 2013-09-25 (×2): 2 via ORAL
  Filled 2013-09-24: qty 1
  Filled 2013-09-24 (×3): qty 2

## 2013-09-24 NOTE — Progress Notes (Signed)
OT Cancellation Note  Patient Details Name: Kevin KennedyDaniel J Banks MRN: [id number] DOB: 1960-11-18   Cancelled Treatment:    Reason Eval/Treat Not Completed: Pain limiting ability to participate  Earlie RavelingStraub, [name] L OTR/L [phone] 09/24/2013, 4:33 PM

## 2013-09-24 NOTE — Progress Notes (Signed)
No issues overnight, however pt had episode of hypotension when up yesterday. Pt has no IV access and reports significant incisional/postop back pain. Has been able to walk in the hallway today.  EXAM:  BP 110/52  Pulse 100  Temp(Src) 98.5 F (36.9 C) (Oral)  Resp 18  Ht 6' (1.829 m)  Wt 87.544 kg (193 lb)  BMI 26.17 kg/m2  SpO2 97%  Awake, alert, oriented  Speech fluent, appropriate  CN grossly intact  5/5 BUE/BLE  Drain in place - 400cc  IMPRESSION:  53 y.o. male s/p 4-level PLIF, recovering slowly as expected  PLAN: - Cont mobilization with PT/OT - Will change to oral pain meds - add extended release oxycodone and PRN percocet for breakthrough pain - Cont drain

## 2013-09-24 NOTE — Progress Notes (Signed)
Physical Therapy Treatment Patient Details Name: Kevin KennedyDaniel J Banks MRN: [id number] DOB: 11/19/1960 Today's Date: 09/24/2013    History of Present Illness Bilateral L2, L3, L4, and L5 laminectomy, bilateral facetectomies at those 4 levels. Lysis of adhesions. Decompression of the L2, L3, L4, L5, and S1 nerve root bilaterally. Bilaterally diskectomy at the level of L2-3, L3-4, L4-5, L5-S1. Interbody fusion with cages of 10 x 26 at the level of L2-3; at the level of L3-4, it was 12 x 26; at the level of L4-L5, it was 10 x 22. Lysis of adhesion and decompression of the L5-S1 area. No cages were used at the level of L5- S1. Insertion of pedicle screws at the level of L2, L3, L4, L5, and S1 bilaterally. Posterolateral arthrodesis with autograft and BMP    PT Comments    Patient making improvement with mobility and gait today.  Pain continues to limit mobility.  Follow Up Recommendations  Home health PT;Supervision/Assistance - 24 hour     Equipment Recommendations  Rolling walker with 5" wheels;3in1 (PT)    Recommendations for Other Services       Precautions / Restrictions Precautions Precautions: Back Precaution Comments: BP improved today Required Braces or Orthoses: Spinal Brace Spinal Brace: Lumbar corset Restrictions Weight Bearing Restrictions: No    Mobility  Bed Mobility Overal bed mobility: Needs Assistance Bed Mobility: Rolling;Sidelying to Sit;Sit to Sidelying Rolling: Min guard Sidelying to sit: Min assist     Sit to sidelying: Min assist General bed mobility comments: Verbal cues for technique.  Increased time for all mobility.  Assist to raise trunk to sitting.  Once upright, good sitting balance.  Encouraged ankle pumps in sitting to help with BP.  Assist to bring LE's onto bed to return to sidelying.  Min assist to don/doff back brace.  Transfers Overall transfer level: Needs assistance Equipment used: Rolling walker (2 wheeled) Transfers: Sit to/from  Stand Sit to Stand: Min assist         General transfer comment: Verbal cues for hand placement and technique.  Assist to rise to standing and for balance initially.  Had patient march in place 20 steps with each foot to help with BP.  Good balance with use of RW.  Ambulation/Gait Ambulation/Gait assistance: Min assist Ambulation Distance (Feet): 62 Feet Assistive device: Rolling walker (2 wheeled) Gait Pattern/deviations: Step-through pattern;Decreased step length - right;Decreased step length - left;Decreased stride length Gait velocity: Slow Gait velocity interpretation: Below normal speed for age/gender General Gait Details: Verbal cues for safe use of RW.  Cues to stand upright.  Assist to maneuver RW in turns initially.  Slow guarded gait.   Stairs            Wheelchair Mobility    Modified Rankin (Stroke Patients Only)       Balance                                    Cognition Arousal/Alertness: Awake/alert Behavior During Therapy: WFL for tasks assessed/performed Overall Cognitive Status: Within Functional Limits for tasks assessed                      Exercises General Exercises - Lower Extremity Ankle Circles/Pumps: AROM;Both;10 reps;Seated Hip Flexion/Marching: AROM;Both;20 reps;Standing    General Comments        Pertinent Vitals/Pain Pain 10/10 with mobility.    Home Living  Prior Function            PT Goals (current goals can now be found in the care plan section) Progress towards PT goals: Progressing toward goals    Frequency  7X/week    PT Plan Current plan remains appropriate    End of Session Equipment Utilized During Treatment: Gait belt;Back brace Activity Tolerance: Patient limited by pain;Patient limited by fatigue Patient left: in bed;with call bell/phone within reach;with family/visitor present     Time: 1610-9604 PT Time Calculation (min): 33 min  Charges:   $Gait Training: 8-22 mins $Therapeutic Activity: 8-22 mins                    G Codes:      Vena Austria 09-Oct-2013, 4:06 PM Durenda Hurt. Renaldo Fiddler, [facility] Acute Rehab Services Pager [phone]

## 2013-09-25 LAB — CBC WITH DIFFERENTIAL/PLATELET
Basophils Absolute: 0 10*3/uL (ref 0.0–0.1)
Basophils Relative: 0 % (ref 0–1)
EOS ABS: 0.1 10*3/uL (ref 0.0–0.7)
EOS PCT: 1 % (ref 0–5)
HCT: 24.3 % — ABNORMAL LOW (ref 39.0–52.0)
Hemoglobin: 8.3 g/dL — ABNORMAL LOW (ref 13.0–17.0)
Lymphocytes Relative: 11 % — ABNORMAL LOW (ref 12–46)
Lymphs Abs: 0.9 10*3/uL (ref 0.7–4.0)
MCH: 31.8 pg (ref 26.0–34.0)
MCHC: 34.2 g/dL (ref 30.0–36.0)
MCV: 93.1 fL (ref 78.0–100.0)
MONO ABS: 1 10*3/uL (ref 0.1–1.0)
Monocytes Relative: 12 % (ref 3–12)
Neutro Abs: 6.4 10*3/uL (ref 1.7–7.7)
Neutrophils Relative %: 77 % (ref 43–77)
PLATELETS: 116 10*3/uL — AB (ref 150–400)
RBC: 2.61 MIL/uL — AB (ref 4.22–5.81)
RDW: 13.7 % (ref 11.5–15.5)
WBC: 8.3 10*3/uL (ref 4.0–10.5)

## 2013-09-25 MED ORDER — OXYCODONE HCL 20 MG/ML PO CONC
15.0000 mg | ORAL | Status: DC | PRN
  Administered 2013-09-25 – 2013-09-26 (×7): 15 mg via ORAL
  Filled 2013-09-25 (×7): qty 1

## 2013-09-25 MED ORDER — FLEET ENEMA 7-19 GM/118ML RE ENEM
1.0000 | ENEMA | Freq: Every day | RECTAL | Status: DC | PRN
  Administered 2013-09-26: 1 via RECTAL
  Filled 2013-09-25: qty 1

## 2013-09-25 MED FILL — Heparin Sodium (Porcine) Inj 1000 Unit/ML: INTRAMUSCULAR | Qty: 30 | Status: AC

## 2013-09-25 MED FILL — Sodium Chloride Irrigation Soln 0.9%: Qty: 3000 | Status: AC

## 2013-09-25 MED FILL — Sodium Chloride IV Soln 0.9%: INTRAVENOUS | Qty: 1000 | Status: AC

## 2013-09-25 NOTE — Progress Notes (Signed)
Urine, clear. hemovac was discontinued. No weakness. Wound dry. Plan to get rehabilitation to see

## 2013-09-25 NOTE — Progress Notes (Signed)
Physical Therapy Treatment Patient Details Name: Kevin KennedyDaniel J Banks MRN: [id number] DOB: 20-May-1961 Today's Date: 09/25/2013    History of Present Illness Bilateral L2, L3, L4, and L5 laminectomy, bilateral facetectomies at those 4 levels. Lysis of adhesions. Decompression of the L2, L3, L4, L5, and S1 nerve root bilaterally. Bilaterally diskectomy at the level of L2-3, L3-4, L4-5, L5-S1. Interbody fusion with cages of 10 x 26 at the level of L2-3; at the level of L3-4, it was 12 x 26; at the level of L4-L5, it was 10 x 22. Lysis of adhesion and decompression of the L5-S1 area. No cages were used at the level of L5- S1. Insertion of pedicle screws at the level of L2, L3, L4, L5, and S1 bilaterally. Posterolateral arthrodesis with autograft and BMP    PT Comments    Patient demonstrates good progress towards PT goals.  Biggest deficits are related to pain management at this time. Patient ambulating around the unit without physical assist. At this time feel patient will benefit from HHPT in his own environment with his own furniture.  Do not feel patient will need inpatient rehab at this time.  Follow Up Recommendations  Home health PT;Supervision/Assistance - PRN     Equipment Recommendations  Rolling walker with 5" wheels;3in1 (PT)    Recommendations for Other Services       Precautions / Restrictions Precautions Precautions: Back Precaution Comments: Reviewed precautions with pt Required Braces or Orthoses: Spinal Brace Spinal Brace: Lumbar corset;Applied in sitting position Restrictions Weight Bearing Restrictions: No    Mobility  Bed Mobility Overal bed mobility: Needs Assistance Bed Mobility: Rolling;Sidelying to Sit Rolling: Min guard Sidelying to sit: Min guard       General bed mobility comments: Verbal cues for technique.  Increased time for all mobility.  Assist to raise trunk to sitting.  Once upright, good sitting balance.  Encouraged ankle pumps in sitting to help  with BP.  Assist to bring LE's onto bed to return to sidelying.  Min assist to don/doff back brace.  Transfers Overall transfer level: Needs assistance Equipment used: Rolling walker (2 wheeled) Transfers: Sit to/from Stand Sit to Stand: Min guard         General transfer comment: VCs for hand placement and safety  Ambulation/Gait Ambulation/Gait assistance: Supervision Ambulation Distance (Feet): 380 Feet Assistive device: Rolling walker (2 wheeled) Gait Pattern/deviations: Step-through pattern;Decreased step length - right;Decreased step length - left;Decreased stride length Gait velocity: decreased Gait velocity interpretation: Below normal speed for age/gender General Gait Details: VCs for increased cadence   Stairs            Wheelchair Mobility    Modified Rankin (Stroke Patients Only)       Balance                                    Cognition Arousal/Alertness: Awake/alert Behavior During Therapy: WFL for tasks assessed/performed Overall Cognitive Status: Within Functional Limits for tasks assessed                      Exercises      General Comments General comments (skin integrity, edema, etc.): discussed mobility expectations for discharge home, spoke at length regarding recommendations and assist. Performed bed mobility and multiple transfers during session.  Teach backs performed for precautions with good recall.        Pertinent Vitals/Pain 8/10 (just pre medicated)  Home Living                      Prior Function            PT Goals (current goals can now be found in the care plan section) Acute Rehab PT Goals Patient Stated Goal: not stated PT Goal Formulation: With patient/family Time For Goal Achievement: 09/30/13 Potential to Achieve Goals: Good Progress towards PT goals: Progressing toward goals    Frequency  7X/week    PT Plan Current plan remains appropriate    End of Session  Equipment Utilized During Treatment: Gait belt;Back brace Activity Tolerance: Patient limited by pain;Patient limited by fatigue Patient left: in bed;with call bell/phone within reach;with family/visitor present     Time: 4098-1191 PT Time Calculation (min): 24 min  Charges:  $Gait Training: 8-22 mins $Self Care/Home Management: 8-22                    G CodesFabio Asa 10-06-13, 1:19 PM Charlotte Crumb, PT DPT  [phone]

## 2013-09-25 NOTE — Progress Notes (Signed)
Physical medicine rehabilitation consult requested. Patient's status post lumbar laminectomy 09/23/2013. Physical and occupational therapy evaluation 08/26/2013 with recommendations of home health therapies. Hold on formal rehabilitation consult at this time with recommendations of home health therapies.

## 2013-09-25 NOTE — Clinical Social Work Note (Signed)
CSW received consult for possible SNF placement at time of discharge. At this time, PT/OT has evaluated pt and are recommending home health services at time of discharge. CSW signing off. Thank you for the consult and please re consult if needed.  Darlyn ChamberEmily [place], LCSWA Clinical Social Worker [phone]

## 2013-09-25 NOTE — Progress Notes (Signed)
Physical Therapy Treatment Patient Details Name: Kevin KennedyDaniel J O'Connell MRN: [id number] DOB: 09/01/1960 Today's Date: 09/25/2013    History of Present Illness Bilateral L2, L3, L4, and L5 laminectomy, bilateral facetectomies at those 4 levels. Lysis of adhesions. Decompression of the L2, L3, L4, L5, and S1 nerve root bilaterally. Bilaterally diskectomy at the level of L2-3, L3-4, L4-5, L5-S1. Interbody fusion with cages of 10 x 26 at the level of L2-3; at the level of L3-4, it was 12 x 26; at the level of L4-L5, it was 10 x 22. Lysis of adhesion and decompression of the L5-S1 area. No cages were used at the level of L5- S1. Insertion of pedicle screws at the level of L2, L3, L4, L5, and S1 bilaterally. Posterolateral arthrodesis with autograft and BMP     Spoke with patient and spouse at length regarding mobility expectations regarding potential for CIR.  Patient in significant pain limiting ability to pariticipate at this time, nsg aware and will pre-medicate prior to return for therapy.  Patient agreeable to participate once medicated. Will follow up.             Precautions / Restrictions Precautions Precautions: Back Precaution Comments: BP improved today Required Braces or Orthoses: Spinal Brace Spinal Brace: Lumbar corset Restrictions Weight Bearing Restrictions: No          Cognition Arousal/Alertness: Awake/alert Behavior During Therapy: WFL for tasks assessed/performed Overall Cognitive Status: Within Functional Limits for tasks assessed                         General Comments General comments (skin integrity, edema, etc.): Spoke with patient and spouse at length regarding mobility expectations regarding potential for CIR.  Patient in significant pain limiting ability to pariticipate at this time, nsg aware and will pre-medicate prior to return for therapy.  Patient agreeable to participate once medicated. Will follow up.       Pertinent Vitals/Pain 8/10            PT Goals (current goals can now be found in the care plan section) Acute Rehab PT Goals Patient Stated Goal: To decrease pain                 Time: 1049-1100 PT Time Calculation (min): 11 min    Fabio AsaWerner, Keano Guggenheim J 09/25/2013, 11:24 AM Charlotte Crumbevon Gianelle Mccaul, PT DPT  [phone]

## 2013-09-25 NOTE — Progress Notes (Addendum)
Occupational Therapy Treatment Patient Details Name: Bella KennedyDaniel J O'Connell MRN: [id number] DOB: 11/21/1960 Today's Date: 09/25/2013    History of present illness Bilateral L2, L3, L4, and L5 laminectomy, bilateral facetectomies at those 4 levels. Lysis of adhesions. Decompression of the L2, L3, L4, L5, and S1 nerve root bilaterally. Bilaterally diskectomy at the level of L2-3, L3-4, L4-5, L5-S1. Interbody fusion with cages of 10 x 26 at the level of L2-3; at the level of L3-4, it was 12 x 26; at the level of L4-L5, it was 10 x 22. Lysis of adhesion and decompression of the L5-S1 area. No cages were used at the level of L5- S1. Insertion of pedicle screws at the level of L2, L3, L4, L5, and S1 bilaterally. Posterolateral arthrodesis with autograft and BMP   OT comments  Practiced with AE for LB ADLs. Education provided during session. Pt also ambulated some in hallway.   Follow Up Recommendations  No OT follow up    Equipment Recommendations  3 in 1 bedside comode    Recommendations for Other Services      Precautions / Restrictions Precautions Precautions: Back Precaution Comments: Reviewed precautions with pt Required Braces or Orthoses: Spinal Brace Spinal Brace: Lumbar corset;Applied in sitting position Restrictions Weight Bearing Restrictions: No       Mobility Bed Mobility Overal bed mobility: Needs Assistance Bed Mobility:Sidelying to Sit Sidelying to sit: Mod assist       General bed mobility comments: Assistance with trunk and bringing legs all the way off bed.    Transfers Overall transfer level: Needs assistance Equipment used: Rolling walker (2 wheeled) Transfers: Sit to/from Stand Sit to Stand: Min assist         General transfer comment: cues for technique.    Balance                                   ADL       Upper Body Dressing : Set up;Supervision/safety;Sitting (brace)   Lower Body Dressing: Set  up;Supervision/safety;Sitting/lateral leans (socks and donned underwear over feet but did not pull up) Toilet Transfer: Min guard;Ambulation;BSC;RW;Minimal assistance (Min A for transfers at times)     Functional mobility during ADLs: Min guard;Rolling walker General ADL Comments: Educated on use of cup for teeth care and placement of grooming items to avoid breaking precautions. Educated on toilet aid for hygiene as pt states this has been difficult in past. Educated on AE for LB ADLs and pt practiced with reacher and sockaid. Educated on use of bag on walker to carry items. Spoke about shower transfer. Pt ambulated in hallway.      Vision                     Perception     Praxis      Cognition   Behavior During Therapy: [facility] for tasks assessed/performed Overall Cognitive Status: Within Functional Limits for tasks assessed                       Extremity/Trunk Assessment               Exercises       General Comments      Pertinent Vitals/ Pain       Pain 10/10. Nurse notified.   Home Living  Prior Functioning/Environment              Frequency Min 3X/week     Progress Toward Goals  OT Goals(current goals can now be found in the care plan section)  Progress towards OT goals: Progressing toward goals  Acute Rehab OT Goals Patient Stated Goal: not stated OT Goal Formulation: With patient Time For Goal Achievement: 09/30/13 Potential to Achieve Goals: Good ADL Goals Pt Will Perform Lower Body Bathing: with set-up;with supervision;with adaptive equipment;sit to/from stand Pt Will Perform Lower Body Dressing: with set-up;with supervision;with adaptive equipment;sit to/from stand Pt Will Transfer to Toilet: with supervision;ambulating;bedside commode (over toilet) Pt Will Perform Toileting - Clothing Manipulation and hygiene: with supervision;sit to/from stand Pt Will Perform  Tub/Shower Transfer: Shower transfer;with supervision;ambulating;3 in 1;rolling walker Additional ADL Goal #1: Pt will be able to get in and OOB with S without rail and HOB flat Additional ADL Goal #2: Pt will be able to state 3/3 back precautions  Plan Discharge plan remains appropriate    End of Session Equipment Utilized During Treatment: Gait belt;Rolling walker;Back brace  Activity Tolerance Patient tolerated treatment well   Patient Left with family/visitor present;with nursing/sitter in room (on 3 in 1)   Nurse Communication  (pain level)        Time: 1610-9604 OT Time Calculation (min): 31 min  Charges: OT General Charges $OT Visit: 1 Procedure OT Treatments $Self Care/Home Management : 8-22 mins $Therapeutic Activity: 8-22 mins  Earlie Raveling OTR/L [phone] 09/25/2013, 12:43 PM

## 2013-09-26 ENCOUNTER — Encounter (HOSPITAL_COMMUNITY): Payer: Self-pay | Admitting: Neurosurgery

## 2013-09-26 MED ORDER — FLEET ENEMA 7-19 GM/118ML RE ENEM: 1.0000 | ENEMA | Freq: Every day | RECTAL | Status: DC | PRN

## 2013-09-26 MED ORDER — OXYCODONE HCL 5 MG PO TABS
15.0000 mg | ORAL_TABLET | ORAL | Status: DC | PRN
  Administered 2013-09-26 – 2013-09-28 (×13): 15 mg via ORAL
  Filled 2013-09-26 (×14): qty 3

## 2013-09-26 NOTE — Progress Notes (Signed)
Occupational Therapy Treatment Patient Details Name: Kevin Banks MRN: [id number] DOB: 08-01-1960 Today's Date: 09/26/2013    History of present illness Bilateral L2, L3, L4, and L5 laminectomy, bilateral facetectomies at those 4 levels. Lysis of adhesions. Decompression of the L2, L3, L4, L5, and S1 nerve root bilaterally. Bilaterally diskectomy at the level of L2-3, L3-4, L4-5, L5-S1. Interbody fusion with cages of 10 x 26 at the level of L2-3; at the level of L3-4, it was 12 x 26; at the level of L4-L5, it was 10 x 22. Lysis of adhesion and decompression of the L5-S1 area. No cages were used at the level of L5- S1. Insertion of pedicle screws at the level of L2, L3, L4, L5, and S1 bilaterally. Posterolateral arthrodesis with autograft and BMP   OT comments  Pt progressing toward acute OT goals. Pt completed shower transfer min guard to 3n1 in walk-in shower. Pt able to state 3/3 back precautions. Education on safe completion of BADLs while following back precautions. Per pt request educated on use of AE for toilet hygenie. Pt has AE kit.   Follow Up Recommendations  No OT follow up    Equipment Recommendations  3 in 1 bedside comode    Recommendations for Other Services      Precautions / Restrictions Precautions Precautions: Back Precaution Comments: Reviewed precautions with pt Required Braces or Orthoses: Spinal Brace Spinal Brace: Lumbar corset;Applied in sitting position       Mobility Bed Mobility                  Transfers Overall transfer level: Modified independent Equipment used: Rolling walker (2 wheeled) Transfers: Sit to/from Stand Sit to Stand: Min guard         General transfer comment: VCs for hand placement and safety    Balance                                   ADL             Toilet Transfer: Supervision/safety;Ambulation;RW Toileting- Clothing Manipulation and Hygiene: Supervision/safety;Sit to/from stand Tub/  Shower Transfer: Min guard;Adhering to back precautions;Ambulation;3 in 1;Walk-in shower;Rolling walker Functional mobility during ADLs: Supervision/safety;Rolling walker General ADL Comments: Practiced shower transfer ambulating with RW to 3n1 in walk-in shower. Per pt request educated and demonstrated toilet hygenie AE. Pt has AE kit.       Vision                     Perception     Praxis      Cognition   Behavior During Therapy: [facility] for tasks assessed/performed Overall Cognitive Status: Within Functional Limits for tasks assessed                       Extremity/Trunk Assessment               Exercises       General Comments      Pertinent Vitals/ Pain      C/o tightness in R hip.  Home Living                                          Prior Functioning/Environment              Frequency  Min 3X/week     Progress Toward Goals  OT Goals(current goals can now be found in the care plan section)  Progress towards OT goals: Progressing toward goals  Acute Rehab OT Goals Patient Stated Goal: not stated OT Goal Formulation: With patient Time For Goal Achievement: 09/30/13 Potential to Achieve Goals: Good ADL Goals Pt Will Perform Lower Body Bathing: with set-up;with supervision;with adaptive equipment;sit to/from stand Pt Will Perform Lower Body Dressing: with set-up;with supervision;with adaptive equipment;sit to/from stand Pt Will Transfer to Toilet: with supervision;ambulating;bedside commode ((over toilet)) Pt Will Perform Toileting - Clothing Manipulation and hygiene: with supervision;sit to/from stand Pt Will Perform Tub/Shower Transfer: Shower transfer;with supervision;ambulating;3 in 1;rolling walker Additional ADL Goal #1: Pt will be able to get in and OOB with S without rail and HOB flat Additional ADL Goal #2: Pt will be able to state 3/3 back precautions  Plan Discharge plan remains appropriate    End of  Session Equipment Utilized During Treatment: Gait belt;Rolling walker;Back brace  Activity Tolerance Patient tolerated treatment well   Patient Left in chair;with call bell/phone within reach   Nurse Communication          Time: 1351-1415 OT Time Calculation (min): 24 min  Charges: OT General Charges $OT Visit: 1 Procedure OT Treatments $Self Care/Home Management : 23-37 mins  Tyrone Schimke OTR/L Pager: [phone]  09/26/2013, 4:38 PM

## 2013-09-26 NOTE — Progress Notes (Signed)
 5mg  liquid oxycodone wasted with Janeann Forehandyeni penada RN

## 2013-09-26 NOTE — Progress Notes (Signed)
Physical Therapy Treatment Patient Details Name: GLEASON ARDOIN MRN: [id number] DOB: 12-22-1960 Today's Date: 09/26/2013    History of Present Illness      PT Comments    Pt. Has progressed and met all PT goals. Pt. I with all precautions. Pt. Encouraged to continue ambulation as tolerated  Follow Up Recommendations  No PT follow up     Equipment Recommendations  Rolling walker with 5" wheels;3in1 (PT)    Recommendations for Other Services       Precautions / Restrictions Precautions Precautions: Back Precaution Comments: Reviewed precautions with pt Required Braces or Orthoses: Spinal Brace Spinal Brace: Lumbar corset;Applied in sitting position Restrictions Weight Bearing Restrictions: No    Mobility  Bed Mobility                  Transfers Overall transfer level: Modified independent Equipment used: Rolling walker (2 wheeled) Transfers: Sit to/from Stand Sit to Stand: Min guard            Ambulation/Gait Ambulation/Gait assistance: Modified independent (Device/Increase time) Ambulation Distance (Feet): 1800 Feet Assistive device: Rolling walker (2 wheeled) Gait Pattern/deviations: Step-through pattern;Decreased stride length Gait velocity: decreased       Stairs Stairs: Yes Stairs assistance: Min assist Stair Management: No rails Number of Stairs: 1 General stair comments: practiced one step twice. PT. needed VC for proper technique   Wheelchair Mobility    Modified Rankin (Stroke Patients Only)       Balance                                    Cognition Arousal/Alertness: Awake/alert Behavior During Therapy: WFL for tasks assessed/performed Overall Cognitive Status: Within Functional Limits for tasks assessed                      Exercises      General Comments        Pertinent Vitals/Pain Denied pain    Home Living                      Prior Function            PT Goals  (current goals can now be found in the care plan section) Progress towards PT goals: Goals met/education completed, patient discharged from PT    Frequency       PT Plan Discharge plan needs to be updated    End of Session Equipment Utilized During Treatment: Gait belt;Back brace Activity Tolerance: Patient tolerated treatment well Patient left: in chair;with call bell/phone within reach     Time: 1105-1140 PT Time Calculation (min): 35 min  Charges:                       G Codes:      Devone Tousley,[place]TA 09/26/2013, 11:47 AM

## 2013-09-26 NOTE — Progress Notes (Signed)
Patient ID: Kevin KennedyDaniel J Banks, male   DOB: 05-13-1961, 53 y.o.   MRN: [id number] Better, able to ambulate with less pain. No weakness. Some pain in left groin area.wound dry. Constipation. See oreedrs

## 2013-09-26 NOTE — Progress Notes (Signed)
Very small BM after enema

## 2013-09-26 NOTE — Progress Notes (Signed)
Agree with updates to POC.  Charlotte Crumbevon Arvil Utz, PT DPT  [phone]

## 2013-09-26 NOTE — Progress Notes (Signed)
Patient OOB to restroom and chair this am, 1 assist, brace and walker. Tolerated well. Will cont to monitor

## 2013-09-28 MED ORDER — DIAZEPAM 5 MG PO TABS: 5.0000 mg | ORAL_TABLET | Freq: Four times a day (QID) | ORAL | Status: AC | PRN

## 2013-09-28 MED ORDER — OXYCODONE HCL ER 15 MG PO T12A: 15.0000 mg | EXTENDED_RELEASE_TABLET | Freq: Two times a day (BID) | ORAL | Status: AC

## 2013-09-28 NOTE — Progress Notes (Signed)
Pt alert and oriented; pt discharge education and instructions completed with pt and spouse at side in room. Both voices understanding and denies any questions. Pt IV removed; pt given his prescription for valium and oxycontin. Pt also received his 3-1 DME. Pt transported off unit via wheel chair with his belongings at side. Kevin MerlesP. Amo Lemonte Al RN.

## 2013-09-28 NOTE — Discharge Summary (Signed)
  Physician Discharge Summary  Patient ID: Kevin KennedyDaniel J O'Connell MRN: [id number] DOB/AGE: 07-14-1960 53 y.o.  Admit date: 09/22/2013 Discharge date: 09/28/2013  Admission Diagnoses: Lumbar spondylosis with stenosis, disc herniation  Discharge Diagnoses: Same Active Problems:   Lumbar degenerative disc disease   Discharged Condition: Stable  Hospital Course:  Mrs. Kevin KennedyDaniel J O'Connell is a 53 y.o. male electively admitted after undergoing L2-S1 decompression and fusion. Postoperatively, the patient was at his neurologic baseline, in expected amount of pain and placed on a PCA pump. As his pain improve, he was transitioned off the PCA and oral medications. He did have some episodes of hypotension on the PCA. These resolved on oral medication. As his pain was better controlled, he began to get out of bed, and was working with physical and occupational therapy. He continued to convalesce as expected and was able to ambulate independently with a walker. He was seen on postoperative day #6 by PT and OT recommended discharge home with a rolling walker and 3 in one commode. He was otherwise stable and the stable for discharge.  Treatments: Surgery - L2-S1 posterior lumbar interbody fusion  Discharge Exam: Blood pressure 111/60, pulse 73, temperature 98.2 F (36.8 C), temperature source Oral, resp. rate 18, height 6' (1.829 m), weight 87.544 kg (193 lb), SpO2 99.00%. Awake, alert, oriented Speech fluent, appropriate CN grossly intact 5/5 BUE/BLE Wound c/d/i  Follow-up: Follow-up in Dr. Jeral FruitBotero office [facility] [phone]) in 2-3 weeks  Disposition: 01-Home or Self Care     Medication List         diazepam 5 MG tablet  Commonly known as:  VALIUM  Take 1 tablet (5 mg total) by mouth every 6 (six) hours as needed for muscle spasms.     DRY EYES OP  Place 2 drops into both eyes daily as needed (dry eyes).     esomeprazole 40 MG capsule  Commonly known as:  NEXIUM   Take 40 mg by mouth at bedtime. Takes at bedtime     oxycodone 30 MG immediate release tablet  Commonly known as:  ROXICODONE  Take 30 mg by mouth every 4 (four) hours as needed for pain.     OxyCODONE 15 mg T12a 12 hr tablet  Commonly known as:  OXYCONTIN  Take 1 tablet (15 mg total) by mouth every 12 (twelve) hours.     silodosin 8 MG Caps capsule  Commonly known as:  RAPAFLO  Take 8 mg by mouth daily after supper.     valsartan-hydrochlorothiazide 160-25 MG per tablet  Commonly known as:  DIOVAN-HCT  Take 1 tablet by mouth continuous as needed.         SignedLisbeth Renshaw: Saanvi Hakala, C 09/28/2013, 1:18 PM

## 2013-09-28 NOTE — Progress Notes (Signed)
OT Cancellation Note  Patient Details Name: Kevin Banks MRN: [id number] DOB: June 19, 1961   Cancelled Treatment:    Reason Eval/Treat Not Completed: Other (comment).  Pt is discharging today and feels comfortable with ADLs and bathroom transfers.  He has AE kit as well as toilet aide.  Will sign off.   [name] 09/28/2013, 2:09 PM Lesle Chris, OTR/L [phone] 09/28/2013

## 2013-09-28 NOTE — Progress Notes (Signed)
[facility] for delivery of DME/ rolling walker and 3:1 to room prior to discharge home today; Alexis GoodellB Krysti Hickling RN,BSN,MHA [phone]

## 2014-03-22 ENCOUNTER — Emergency Department (HOSPITAL_BASED_OUTPATIENT_CLINIC_OR_DEPARTMENT_OTHER)
Admission: EM | Admit: 2014-03-22 | Discharge: 2014-03-22 | Disposition: A | Discharge status: Home / Self Care | Payer: BC Managed Care – PPO | Attending: Emergency Medicine | Admitting: Emergency Medicine

## 2014-03-22 ENCOUNTER — Encounter (HOSPITAL_BASED_OUTPATIENT_CLINIC_OR_DEPARTMENT_OTHER): Payer: Self-pay | Admitting: Emergency Medicine

## 2014-03-22 ENCOUNTER — Emergency Department (HOSPITAL_BASED_OUTPATIENT_CLINIC_OR_DEPARTMENT_OTHER): Payer: BC Managed Care – PPO

## 2014-03-22 DIAGNOSIS — I10 Essential (primary) hypertension: Secondary | ICD-10-CM | POA: Insufficient documentation

## 2014-03-22 DIAGNOSIS — Z87442 Personal history of urinary calculi: Secondary | ICD-10-CM | POA: Diagnosis not present

## 2014-03-22 DIAGNOSIS — Z79899 Other long term (current) drug therapy: Secondary | ICD-10-CM | POA: Diagnosis not present

## 2014-03-22 DIAGNOSIS — S52023A Displaced fracture of olecranon process without intraarticular extension of unspecified ulna, initial encounter for closed fracture: Secondary | ICD-10-CM | POA: Diagnosis not present

## 2014-03-22 DIAGNOSIS — K219 Gastro-esophageal reflux disease without esophagitis: Secondary | ICD-10-CM | POA: Insufficient documentation

## 2014-03-22 DIAGNOSIS — Z87891 Personal history of nicotine dependence: Secondary | ICD-10-CM | POA: Diagnosis not present

## 2014-03-22 DIAGNOSIS — Z8639 Personal history of other endocrine, nutritional and metabolic disease: Secondary | ICD-10-CM | POA: Diagnosis not present

## 2014-03-22 DIAGNOSIS — R55 Syncope and collapse: Secondary | ICD-10-CM

## 2014-03-22 DIAGNOSIS — R42 Dizziness and giddiness: Secondary | ICD-10-CM | POA: Insufficient documentation

## 2014-03-22 DIAGNOSIS — Y9389 Activity, other specified: Secondary | ICD-10-CM | POA: Insufficient documentation

## 2014-03-22 DIAGNOSIS — Y929 Unspecified place or not applicable: Secondary | ICD-10-CM | POA: Insufficient documentation

## 2014-03-22 DIAGNOSIS — Z862 Personal history of diseases of the blood and blood-forming organs and certain disorders involving the immune mechanism: Secondary | ICD-10-CM | POA: Insufficient documentation

## 2014-03-22 DIAGNOSIS — R404 Transient alteration of awareness: Secondary | ICD-10-CM | POA: Insufficient documentation

## 2014-03-22 DIAGNOSIS — IMO0002 Reserved for concepts with insufficient information to code with codable children: Secondary | ICD-10-CM | POA: Insufficient documentation

## 2014-03-22 DIAGNOSIS — Z8739 Personal history of other diseases of the musculoskeletal system and connective tissue: Secondary | ICD-10-CM | POA: Insufficient documentation

## 2014-03-22 DIAGNOSIS — S52022A Displaced fracture of olecranon process without intraarticular extension of left ulna, initial encounter for closed fracture: Secondary | ICD-10-CM

## 2014-03-22 LAB — BASIC METABOLIC PANEL
Anion gap: 14 (ref 5–15)
BUN: 11 mg/dL (ref 6–23)
CO2: 29 mEq/L (ref 19–32)
CREATININE: 1.2 mg/dL (ref 0.50–1.35)
Calcium: 9.7 mg/dL (ref 8.4–10.5)
Chloride: 95 mEq/L — ABNORMAL LOW (ref 96–112)
GFR calc Af Amer: 77 mL/min — ABNORMAL LOW (ref >90)
GFR, EST NON AFRICAN AMERICAN: 66 mL/min — AB (ref >90)
GLUCOSE: 89 mg/dL (ref 70–99)
Potassium: 3.7 mEq/L (ref 3.7–5.3)
SODIUM: 138 meq/L (ref 137–147)

## 2014-03-22 LAB — CBC WITH DIFFERENTIAL/PLATELET
BASOS ABS: 0 10*3/uL (ref 0.0–0.1)
Basophils Relative: 0 % (ref 0–1)
EOS ABS: 0 10*3/uL (ref 0.0–0.7)
EOS PCT: 1 % (ref 0–5)
HCT: 47.2 % (ref 39.0–52.0)
Hemoglobin: 16.4 g/dL (ref 13.0–17.0)
LYMPHS PCT: 18 % (ref 12–46)
Lymphs Abs: 1.3 10*3/uL (ref 0.7–4.0)
MCH: 31.6 pg (ref 26.0–34.0)
MCHC: 34.7 g/dL (ref 30.0–36.0)
MCV: 90.9 fL (ref 78.0–100.0)
MONO ABS: 0.8 10*3/uL (ref 0.1–1.0)
Monocytes Relative: 12 % (ref 3–12)
Neutro Abs: 4.9 10*3/uL (ref 1.7–7.7)
Neutrophils Relative %: 69 % (ref 43–77)
Platelets: 213 10*3/uL (ref 150–400)
RBC: 5.19 MIL/uL (ref 4.22–5.81)
RDW: 15.1 % (ref 11.5–15.5)
WBC: 7 10*3/uL (ref 4.0–10.5)

## 2014-03-22 MED ORDER — ZOLPIDEM TARTRATE 5 MG PO TABS: 5.0000 mg | ORAL_TABLET | Freq: Every evening | ORAL | Status: AC | PRN

## 2014-03-22 MED ORDER — OXYCODONE HCL ER 30 MG PO T12A: 1.0000 | EXTENDED_RELEASE_TABLET | Freq: Two times a day (BID) | ORAL | Status: AC

## 2014-03-22 NOTE — Discharge Instructions (Signed)

## 2014-03-22 NOTE — ED Notes (Signed)
Given copy of his xrays on a CD to take to his Ortho appointment with Dr Idolina Primer

## 2014-03-22 NOTE — ED Notes (Addendum)
States he has been dizzy all AM with standing. He passed out and fell against a dresser hitting his left elbow, left hip and left left side of his head. Guarding his elbow. Hx of back surgery 6 months ago and his pain medication dosages were changed yesterday. He started going to pain management yesterday. Withdrawals per patient. Was started on Pamelor yesterday.

## 2014-03-24 NOTE — ED Provider Notes (Signed)
CSN: [id number]     Arrival date & time 03/22/14  1128 History   First MD Initiated Contact with Patient 03/22/14 1150     Chief Complaint  Patient presents with  . Loss of Consciousness     (Consider location/radiation/quality/duration/timing/severity/associated sxs/prior Treatment) HPI Patient just started on new medication for sleep.  Had pain medication reduced. Having dizziness when standing.  Had syncope and hit left elbow on chest of drawers.  Has pain to L elbow.  Denies headache or chest pain.   Past Medical History  Diagnosis Date  . Hiatal hernia   . GERD (gastroesophageal reflux disease)   . Diverticulosis   . Hemorrhoids   . Diverticulitis   . Erosive esophagitis   . IBS (irritable bowel syndrome)   . Spinal disorder 01-12-13    spinal stenosis,DDD, bulging disc-  11-08-12 last steroid injection  . Hyperlipidemia   . History of kidney stones 01-12-13    past hx. and has some at present-not a problem  . PONV (postoperative nausea and vomiting)     Phnergan is the most effective for this,Zofran usually doesn't work  . Hypertension     dr Lendon Colonel    [place]   Past Surgical History  Procedure Laterality Date  . Back surgery    . Neck surgery      cervical fusion,anterior approach-Dr. Jeral Fruit  . Hand surgery Right     tendon repair  . Ankle reconstruction    . Tonsillectomy    . Appendectomy    . Lower back      dec 2011, feb 2012  . Colon surgery    . Ventral hernia repair N/A 01/27/2013    Procedure: LAPAROSCOPIC VENTRAL incisional HERNIA;  Surgeon: Mariella Saa, MD;  Location: [facility];  Service: General;  Laterality: N/A;  With Mesh  . Hernia repair  01/27/2013    umbilical hernia repair  . Posterior lumbar fusion 4 level N/A 09/22/2013    Procedure: POSTERIOR LUMBAR INTERBODY FUSION LUMBAR TWO-THREE,LUMBAR THREE-FOUR,LUMBAR FOUR-FIVE LUMBAR FIVE SACRAL-ONE,CAGES PEDICLE SCREWS POSTERIOR LATERAL ARTHRODESIS;  Surgeon: Karn Cassis, MD;  Location: [facility];   Service: Neurosurgery;  Laterality: N/A;  POSTERIOR LUMBAR INTERBODY FUSION LUMBAR TWO-THREE,LUMBAR THREE-FOUR,LUMBAR FOUR-FIVE LUMBAR FIVE SACRAL-ONE,CAGES PEDICLE SCREWS   Family History  Problem Relation Age of Onset  . Breast cancer Sister   . Cancer Sister   . Diabetes Father    History  Substance Use Topics  . Smoking status: Former Smoker -- 0.25 packs/day    Types: Cigarettes    Quit date: 01/15/2013  . Smokeless tobacco: Never Used  . Alcohol Use: Yes     Comment: rarely    Review of Systems  Cardiovascular: Negative for chest pain, palpitations and leg swelling.  Neurological: Positive for dizziness, syncope and light-headedness. Negative for seizures, speech difficulty, weakness and numbness.  All other systems reviewed and are negative.     Allergies  Morphine and related  Home Medications   Prior to Admission medications   Medication Sig Start Date End Date Taking? Authorizing Provider  BACLOFEN PO Take by mouth.   Yes Historical Provider, MD  Artificial Tear Ointment (DRY EYES OP) Place 2 drops into both eyes daily as needed (dry eyes).    Historical Provider, MD  diazepam (VALIUM) 5 MG tablet Take 1 tablet (5 mg total) by mouth every 6 (six) hours as needed for muscle spasms. 09/28/13   Lisbeth Renshaw, MD  esomeprazole (NEXIUM) 40 MG capsule Take 40  mg by mouth at bedtime. Takes at bedtime    Historical Provider, MD  OxyCODONE (OXYCONTIN) 15 mg T12A 12 hr tablet Take 1 tablet (15 mg total) by mouth every 12 (twelve) hours. 09/28/13   Lisbeth Renshaw, MD  oxycodone (ROXICODONE) 30 MG immediate release tablet Take 30 mg by mouth every 4 (four) hours as needed for pain.     Historical Provider, MD  OxyCODONE HCl ER 30 MG T12A Take 1 tablet by mouth 2 (two) times daily. 03/22/14   Nelia Shi, MD  silodosin (RAPAFLO) 8 MG CAPS capsule Take 8 mg by mouth daily after supper.    Historical Provider, MD  valsartan-hydrochlorothiazide (DIOVAN-HCT) 160-25 MG per  tablet Take 1 tablet by mouth continuous as needed.    Historical Provider, MD  zolpidem (AMBIEN) 5 MG tablet Take 1 tablet (5 mg total) by mouth at bedtime as needed for sleep. 03/22/14   Nelia Shi, MD   BP 110/75  Pulse 98  Temp(Src) 98.8 F (37.1 C) (Oral)  Resp 16  Ht 6' (1.829 m)  Wt 180 lb (81.647 kg)  BMI 24.41 kg/m2  SpO2 100% Physical Exam  Nursing note and vitals reviewed. Constitutional: He is oriented to person, place, and time. He appears well-developed and well-nourished. No distress.  HENT:  Head: Normocephalic and atraumatic.  Eyes: Pupils are equal, round, and reactive to light.  Neck: Normal range of motion.  Cardiovascular: Normal rate and intact distal pulses.   Pulmonary/Chest: No respiratory distress.  Abdominal: Normal appearance. He exhibits no distension.  Musculoskeletal: He exhibits tenderness.       Left elbow: He exhibits decreased range of motion and swelling. Tenderness found. Lateral epicondyle and olecranon process tenderness noted.  Neurological: He is alert and oriented to person, place, and time. No cranial nerve deficit.  Skin: Skin is warm and dry. No rash noted.  Psychiatric: He has a normal mood and affect. His behavior is normal.    ED Course  Procedures (including critical care time) Labs Review Labs Reviewed  BASIC METABOLIC PANEL - Abnormal; Notable for the following:    Chloride 95 (*)    GFR calc non Af Amer 66 (*)    GFR calc Af Amer 77 (*)    All other components within normal limits  CBC WITH DIFFERENTIAL    Imaging Review Results for orders placed during the hospital encounter of 03/22/14  BASIC METABOLIC PANEL      Result Value Ref Range   Sodium 138  137 - 147 mEq/L   Potassium 3.7  3.7 - 5.3 mEq/L   Chloride 95 (*) 96 - 112 mEq/L   CO2 29  19 - 32 mEq/L   Glucose, Bld 89  70 - 99 mg/dL   BUN 11  6 - 23 mg/dL   Creatinine, Ser 4.40  0.50 - 1.35 mg/dL   Calcium 9.7  8.4 - 10.2 mg/dL   GFR calc non Af Amer 66  (*) >90 mL/min   GFR calc Af Amer 77 (*) >90 mL/min   Anion gap 14  5 - 15  CBC WITH DIFFERENTIAL      Result Value Ref Range   WBC 7.0  4.0 - 10.5 K/uL   RBC 5.19  4.22 - 5.81 MIL/uL   Hemoglobin 16.4  13.0 - 17.0 g/dL   HCT 72.5  36.6 - 44.0 %   MCV 90.9  78.0 - 100.0 fL   MCH 31.6  26.0 - 34.0 pg  MCHC 34.7  30.0 - 36.0 g/dL   RDW 16.1  09.6 - 04.5 %   Platelets 213  150 - 400 K/uL   Neutrophils Relative % 69  43 - 77 %   Neutro Abs 4.9  1.7 - 7.7 K/uL   Lymphocytes Relative 18  12 - 46 %   Lymphs Abs 1.3  0.7 - 4.0 K/uL   Monocytes Relative 12  3 - 12 %   Monocytes Absolute 0.8  0.1 - 1.0 K/uL   Eosinophils Relative 1  0 - 5 %   Eosinophils Absolute 0.0  0.0 - 0.7 K/uL   Basophils Relative 0  0 - 1 %   Basophils Absolute 0.0  0.0 - 0.1 K/uL   Dg Elbow Complete Left  03/22/2014   CLINICAL DATA:  Trauma.  EXAM: LEFT ELBOW - COMPLETE 3+ VIEW  COMPARISON:  None.  FINDINGS: Comminuted displaced proximal ulna/ olecranon fracture is present. Radius and humerus are intact.  IMPRESSION: Comminuted displaced proximal ulna/olecranon fracture.   Electronically Signed   By: Maisie Fus  Register   On: 03/22/2014 12:27    I spoke with Dr. Wandra Feinstein per patient request.  Will place in posterir splint and he will see tomorrow.   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  Will stop his new medication and try Ambien for sleep.  Will follow with Dr. Eulah Pont for care of elbow fracture.  Stable for discharge. MDM   Final diagnoses:  Syncope, unspecified syncope type  Olecranon fracture, left, closed, initial encounter        Nelia Shi, MD 03/24/14 2203

## 2014-04-28 IMAGING — CR DG CHEST 2V
2 series · 2 of 2 positions shown · non-contrast
Comparison: June 11, 2010

CLINICAL DATA: Pre admission for back surgery

EXAM:
CHEST  2 VIEW

[w chest pa]
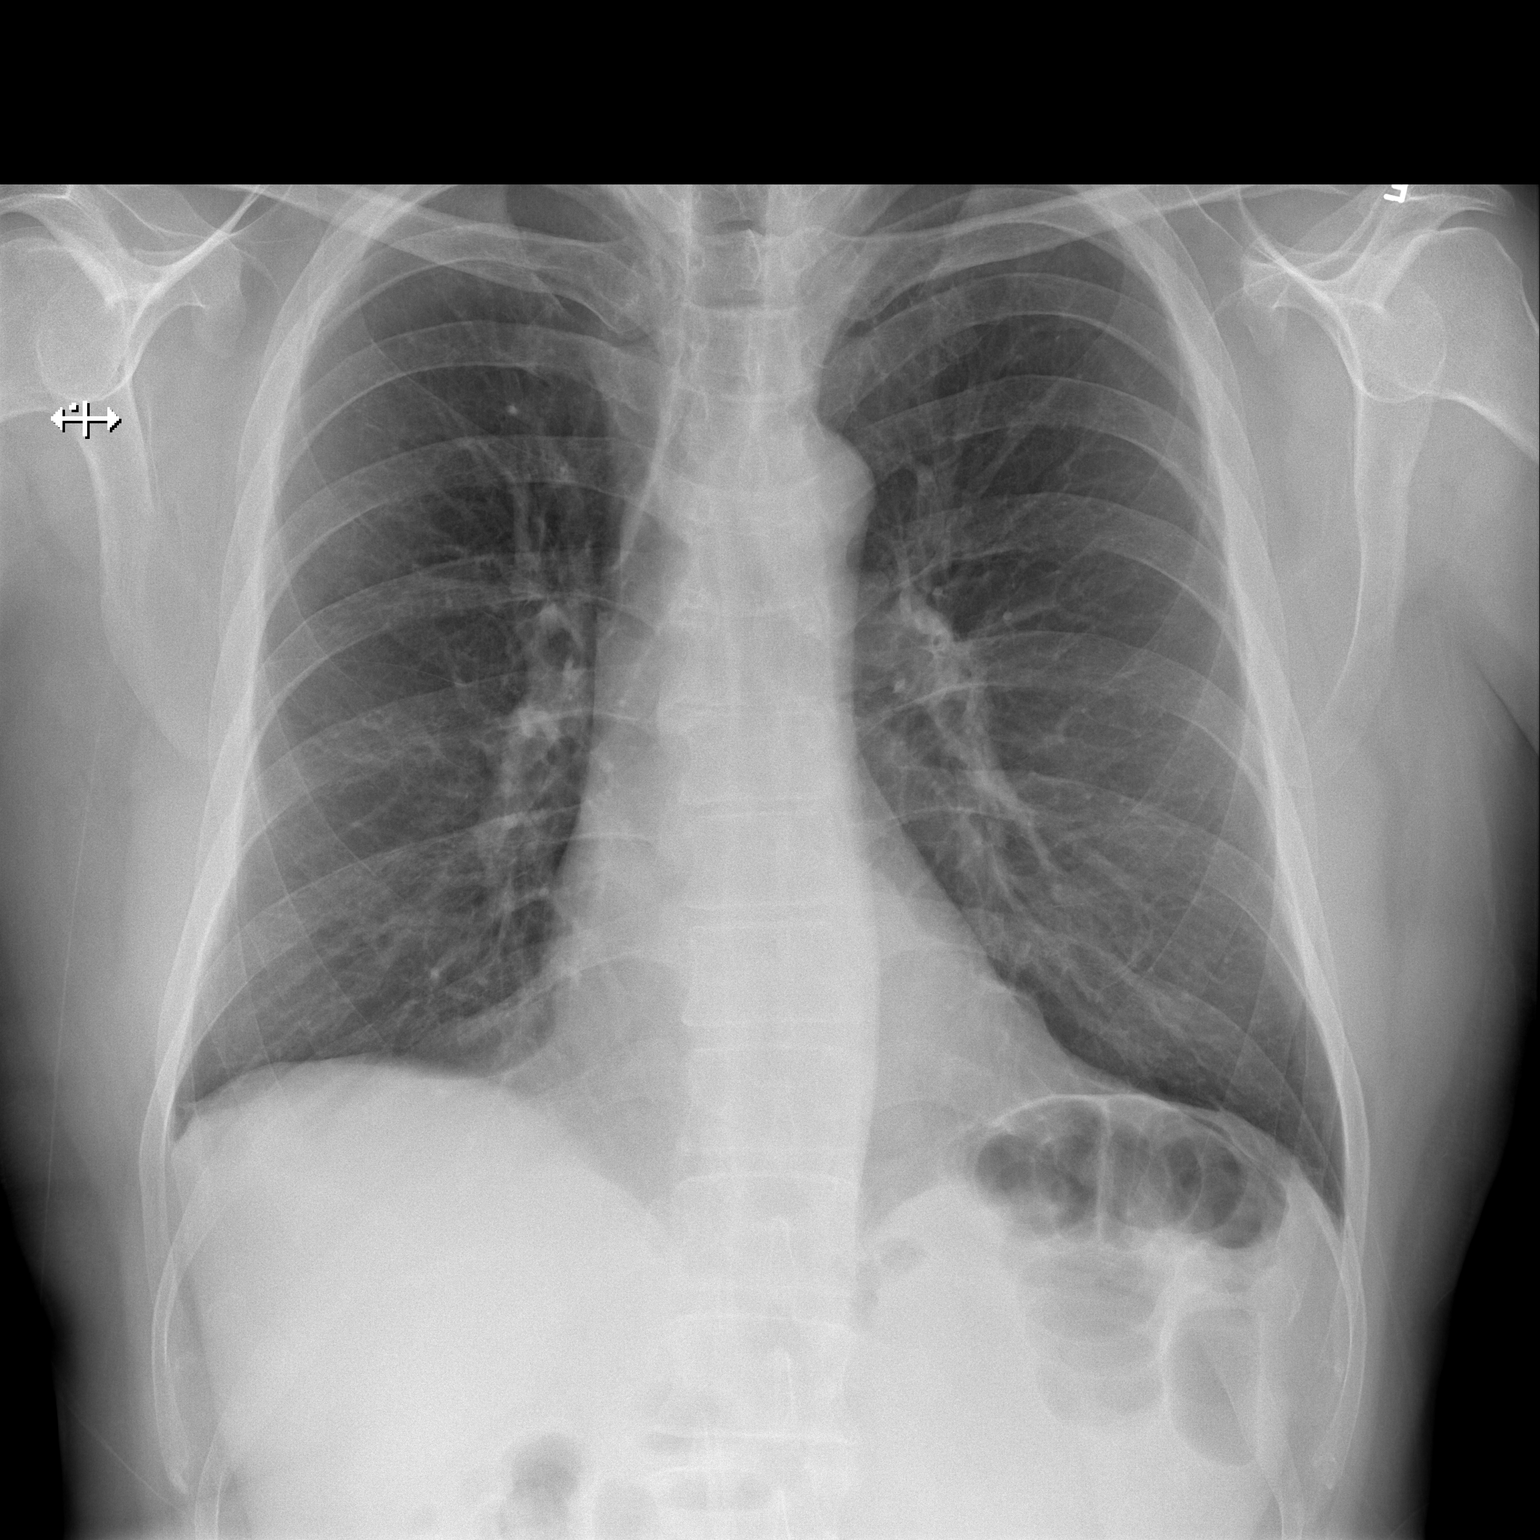

[w chest lat]
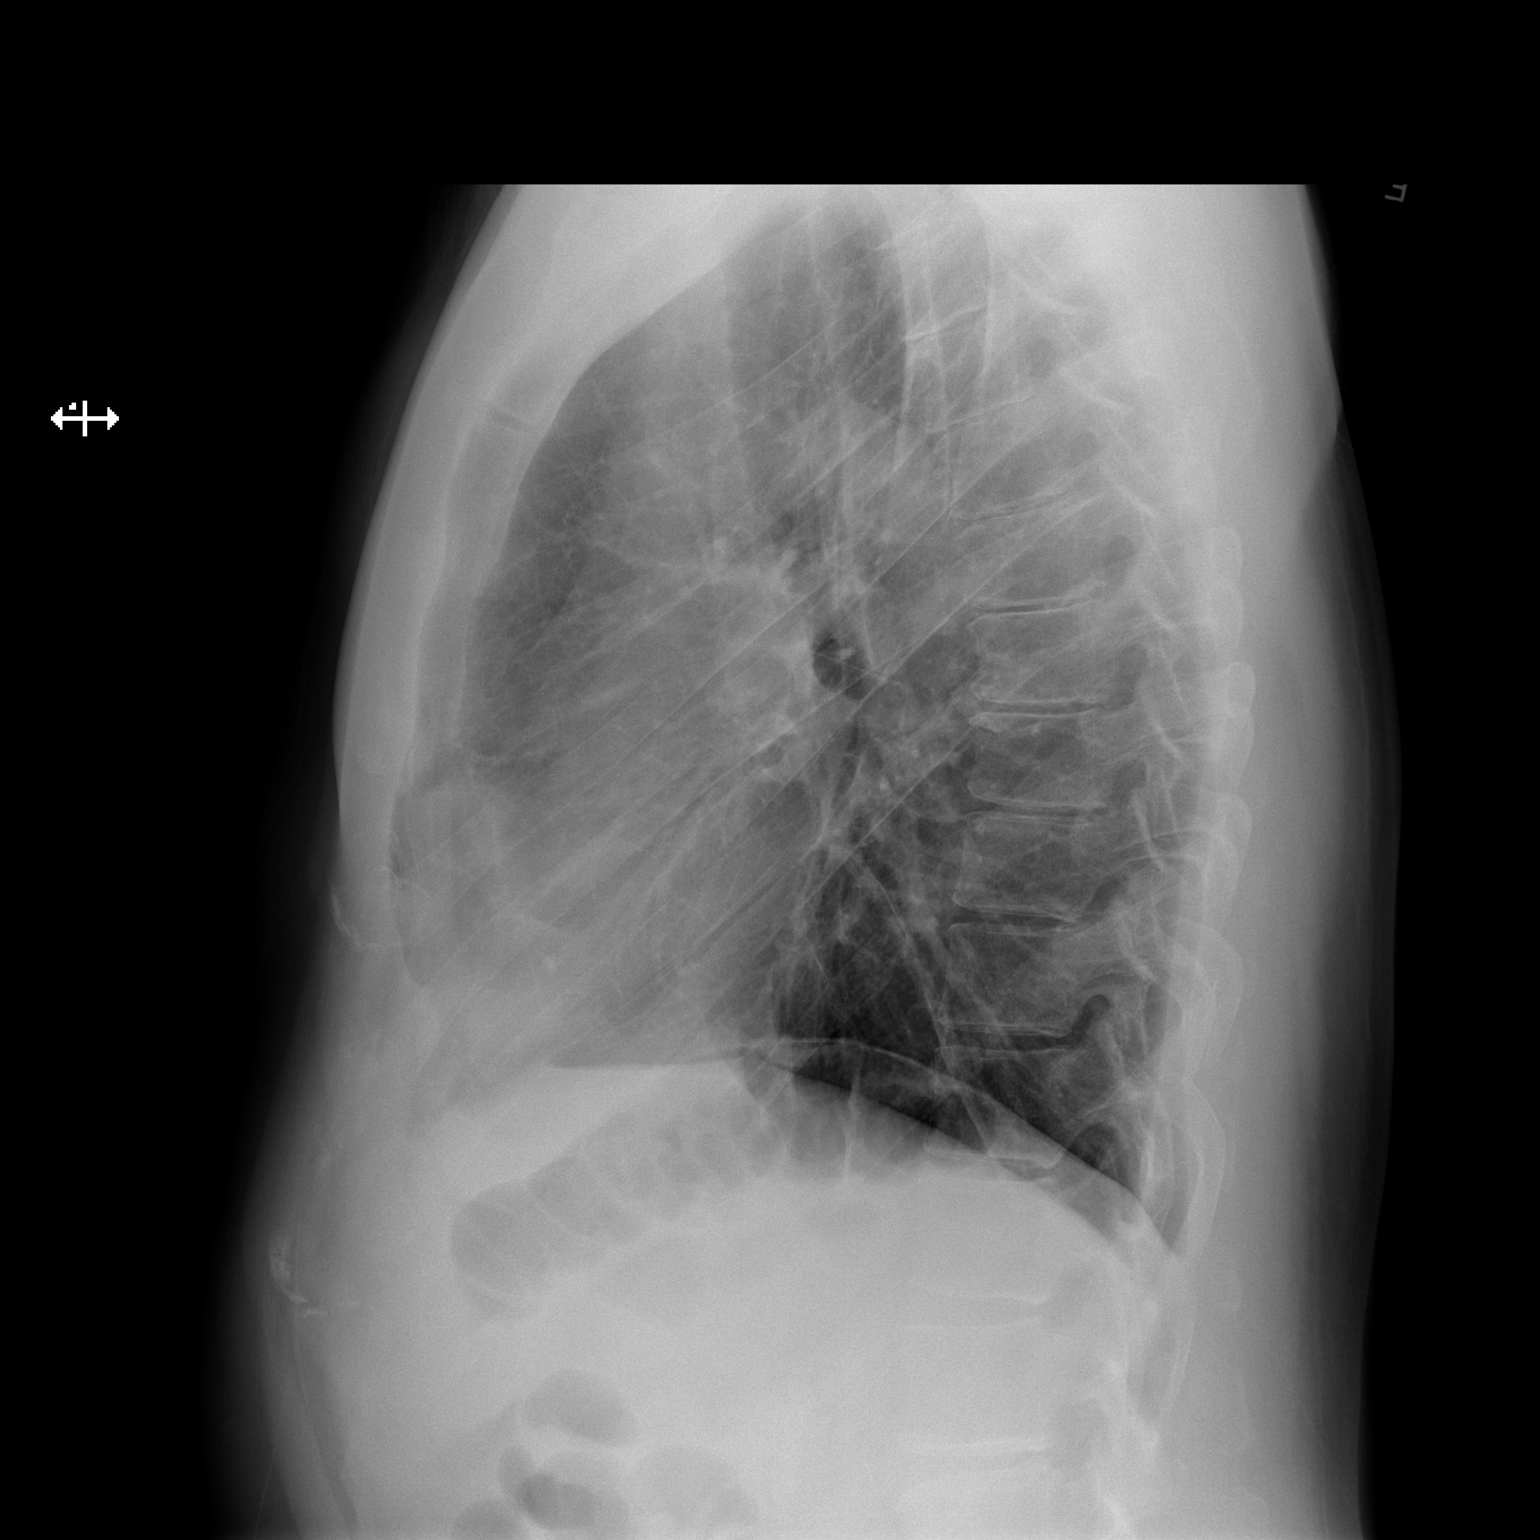

[2 of 2 positions shown; findings below may reference images not displayed]

FINDINGS: The heart size and mediastinal contours are within normal limits.
Both lungs are clear. The visualized skeletal structures are stable.
Patient is status post prior fixation of lower cervical spine.
IMPRESSION: No active cardiopulmonary disease.

## 2014-05-06 IMAGING — DX DG LUMBAR SPINE 2-3V
1 series · 1 of 1 positions shown · non-contrast
Comparison: CT L SPINE W/CM dated 08/09/2013

CLINICAL DATA: Preoperative localization lateral lumbar spine films

EXAM:
LUMBAR SPINE - 2-3 VIEW

[lat]
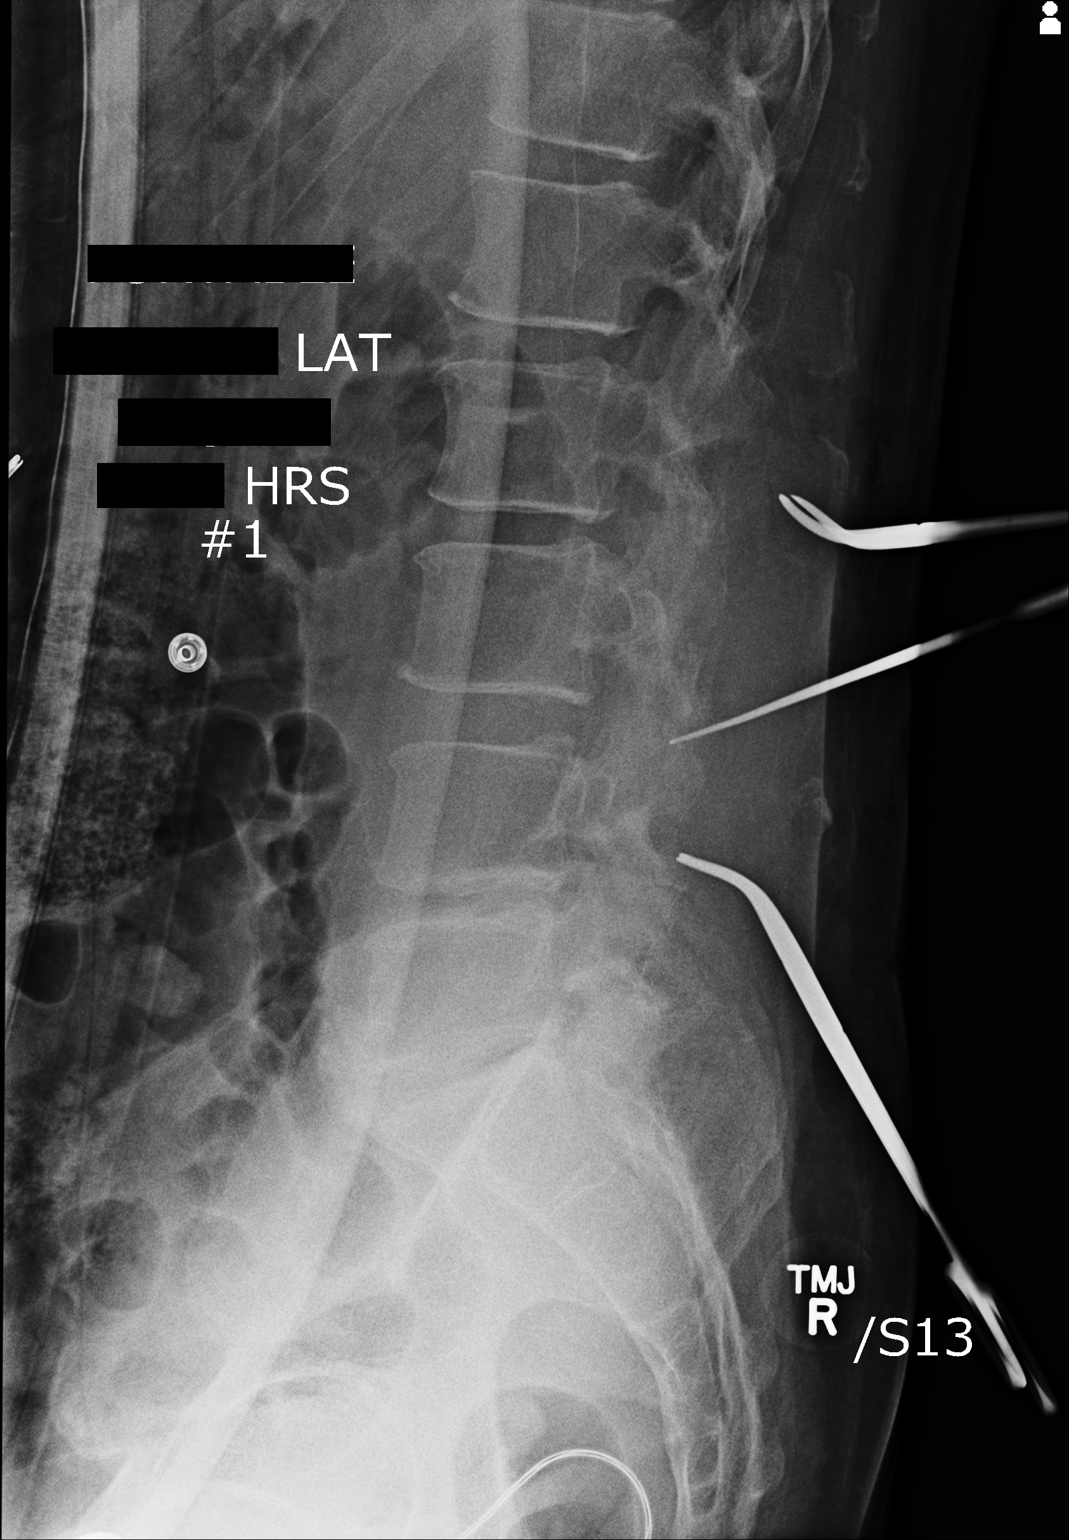

[1 of 1 positions shown; findings below may reference images not displayed]

FINDINGS: On image #1 the upper and lower metallic markers project over the
spinous processes of L2 and L4 respectively. The pointed metallic
probe projects 2 cm posterior to the posterior margin of the L3-4
disc space. Disc space narrowing at L4-5 is present.

On image #2 the pointed tip of the metal probe overlies the
posterior aspect of the L2-3 disc space. The metallic. Superiorly
metallic clamp lies posterior to this level. The inferior metallic
clamp lies posterior to the L4-L5 disc space.
IMPRESSION: Positioning of the metallic clamps and probes is as described above.

## 2015-04-15 ENCOUNTER — Encounter: Payer: Self-pay | Admitting: Gastroenterology

## 2017-06-15 DIAGNOSIS — I10 Essential (primary) hypertension: Secondary | ICD-10-CM | POA: Diagnosis not present

## 2017-06-15 DIAGNOSIS — Z6829 Body mass index (BMI) 29.0-29.9, adult: Secondary | ICD-10-CM | POA: Diagnosis not present

## 2017-06-15 DIAGNOSIS — M961 Postlaminectomy syndrome, not elsewhere classified: Secondary | ICD-10-CM | POA: Diagnosis not present

## 2017-06-15 DIAGNOSIS — M545 Low back pain: Secondary | ICD-10-CM | POA: Diagnosis not present

## 2017-06-23 DIAGNOSIS — N138 Other obstructive and reflux uropathy: Secondary | ICD-10-CM | POA: Diagnosis not present

## 2017-06-23 DIAGNOSIS — N401 Enlarged prostate with lower urinary tract symptoms: Secondary | ICD-10-CM | POA: Diagnosis not present

## 2017-06-25 DIAGNOSIS — N5201 Erectile dysfunction due to arterial insufficiency: Secondary | ICD-10-CM | POA: Diagnosis not present

## 2017-06-25 DIAGNOSIS — R3912 Poor urinary stream: Secondary | ICD-10-CM | POA: Diagnosis not present

## 2017-06-25 DIAGNOSIS — N401 Enlarged prostate with lower urinary tract symptoms: Secondary | ICD-10-CM | POA: Diagnosis not present

## 2017-06-25 DIAGNOSIS — N138 Other obstructive and reflux uropathy: Secondary | ICD-10-CM | POA: Diagnosis not present

## 2017-07-28 DIAGNOSIS — L718 Other rosacea: Secondary | ICD-10-CM | POA: Diagnosis not present

## 2017-09-02 DIAGNOSIS — M545 Low back pain: Secondary | ICD-10-CM | POA: Diagnosis not present

## 2017-09-02 DIAGNOSIS — M961 Postlaminectomy syndrome, not elsewhere classified: Secondary | ICD-10-CM | POA: Diagnosis not present

## 2017-09-02 DIAGNOSIS — M5416 Radiculopathy, lumbar region: Secondary | ICD-10-CM | POA: Diagnosis not present

## 2017-09-02 DIAGNOSIS — I10 Essential (primary) hypertension: Secondary | ICD-10-CM | POA: Diagnosis not present

## 2017-09-08 DIAGNOSIS — L309 Dermatitis, unspecified: Secondary | ICD-10-CM | POA: Diagnosis not present

## 2017-09-08 DIAGNOSIS — M7022 Olecranon bursitis, left elbow: Secondary | ICD-10-CM | POA: Diagnosis not present

## 2017-09-09 DIAGNOSIS — M7022 Olecranon bursitis, left elbow: Secondary | ICD-10-CM | POA: Diagnosis not present

## 2017-11-18 DIAGNOSIS — M545 Low back pain: Secondary | ICD-10-CM | POA: Diagnosis not present

## 2017-11-18 DIAGNOSIS — M961 Postlaminectomy syndrome, not elsewhere classified: Secondary | ICD-10-CM | POA: Diagnosis not present

## 2017-11-18 DIAGNOSIS — M5416 Radiculopathy, lumbar region: Secondary | ICD-10-CM | POA: Diagnosis not present

## 2018-02-03 ENCOUNTER — Encounter: Payer: Self-pay | Admitting: Gastroenterology

## 2018-03-09 DIAGNOSIS — M961 Postlaminectomy syndrome, not elsewhere classified: Secondary | ICD-10-CM | POA: Diagnosis not present

## 2018-03-09 DIAGNOSIS — M5416 Radiculopathy, lumbar region: Secondary | ICD-10-CM | POA: Diagnosis not present

## 2018-03-09 DIAGNOSIS — I10 Essential (primary) hypertension: Secondary | ICD-10-CM | POA: Diagnosis not present

## 2018-03-09 DIAGNOSIS — M545 Low back pain: Secondary | ICD-10-CM | POA: Diagnosis not present

## 2018-05-30 DIAGNOSIS — I10 Essential (primary) hypertension: Secondary | ICD-10-CM | POA: Diagnosis not present

## 2018-05-30 DIAGNOSIS — M5416 Radiculopathy, lumbar region: Secondary | ICD-10-CM | POA: Diagnosis not present

## 2018-05-30 DIAGNOSIS — M545 Low back pain: Secondary | ICD-10-CM | POA: Diagnosis not present

## 2018-05-30 DIAGNOSIS — Z6828 Body mass index (BMI) 28.0-28.9, adult: Secondary | ICD-10-CM | POA: Diagnosis not present

## 2018-06-06 DIAGNOSIS — R0982 Postnasal drip: Secondary | ICD-10-CM | POA: Diagnosis not present

## 2018-06-06 DIAGNOSIS — J029 Acute pharyngitis, unspecified: Secondary | ICD-10-CM | POA: Diagnosis not present

## 2018-06-16 DIAGNOSIS — N138 Other obstructive and reflux uropathy: Secondary | ICD-10-CM | POA: Diagnosis not present

## 2018-06-16 DIAGNOSIS — N401 Enlarged prostate with lower urinary tract symptoms: Secondary | ICD-10-CM | POA: Diagnosis not present

## 2018-06-23 DIAGNOSIS — R3912 Poor urinary stream: Secondary | ICD-10-CM | POA: Diagnosis not present

## 2018-06-23 DIAGNOSIS — N401 Enlarged prostate with lower urinary tract symptoms: Secondary | ICD-10-CM | POA: Diagnosis not present

## 2018-06-23 DIAGNOSIS — N138 Other obstructive and reflux uropathy: Secondary | ICD-10-CM | POA: Diagnosis not present

## 2018-07-25 DIAGNOSIS — I10 Essential (primary) hypertension: Secondary | ICD-10-CM | POA: Diagnosis not present

## 2018-07-25 DIAGNOSIS — M961 Postlaminectomy syndrome, not elsewhere classified: Secondary | ICD-10-CM | POA: Diagnosis not present

## 2018-07-25 DIAGNOSIS — Z6828 Body mass index (BMI) 28.0-28.9, adult: Secondary | ICD-10-CM | POA: Diagnosis not present

## 2018-07-25 DIAGNOSIS — M5416 Radiculopathy, lumbar region: Secondary | ICD-10-CM | POA: Diagnosis not present

## 2018-08-25 DIAGNOSIS — M961 Postlaminectomy syndrome, not elsewhere classified: Secondary | ICD-10-CM | POA: Diagnosis not present

## 2018-08-25 DIAGNOSIS — R030 Elevated blood-pressure reading, without diagnosis of hypertension: Secondary | ICD-10-CM | POA: Diagnosis not present

## 2018-08-25 DIAGNOSIS — M5416 Radiculopathy, lumbar region: Secondary | ICD-10-CM | POA: Diagnosis not present

## 2018-08-25 DIAGNOSIS — Z6829 Body mass index (BMI) 29.0-29.9, adult: Secondary | ICD-10-CM | POA: Diagnosis not present

## 2018-09-04 DIAGNOSIS — J019 Acute sinusitis, unspecified: Secondary | ICD-10-CM | POA: Diagnosis not present

## 2018-11-23 DIAGNOSIS — M545 Low back pain: Secondary | ICD-10-CM | POA: Diagnosis not present

## 2018-11-30 DIAGNOSIS — R61 Generalized hyperhidrosis: Secondary | ICD-10-CM | POA: Diagnosis not present

## 2018-11-30 DIAGNOSIS — I10 Essential (primary) hypertension: Secondary | ICD-10-CM | POA: Diagnosis not present

## 2018-12-21 DIAGNOSIS — I10 Essential (primary) hypertension: Secondary | ICD-10-CM | POA: Diagnosis not present

## 2019-02-15 DIAGNOSIS — M5416 Radiculopathy, lumbar region: Secondary | ICD-10-CM | POA: Diagnosis not present

## 2019-02-15 DIAGNOSIS — M545 Low back pain: Secondary | ICD-10-CM | POA: Diagnosis not present

## 2019-02-15 DIAGNOSIS — I10 Essential (primary) hypertension: Secondary | ICD-10-CM | POA: Diagnosis not present

## 2019-03-13 DIAGNOSIS — Z1211 Encounter for screening for malignant neoplasm of colon: Secondary | ICD-10-CM | POA: Diagnosis not present

## 2019-03-13 DIAGNOSIS — Z23 Encounter for immunization: Secondary | ICD-10-CM | POA: Diagnosis not present

## 2019-03-13 DIAGNOSIS — K219 Gastro-esophageal reflux disease without esophagitis: Secondary | ICD-10-CM | POA: Diagnosis not present

## 2019-03-13 DIAGNOSIS — K146 Glossodynia: Secondary | ICD-10-CM | POA: Diagnosis not present

## 2019-03-13 DIAGNOSIS — T280XXA Burn of mouth and pharynx, initial encounter: Secondary | ICD-10-CM | POA: Diagnosis not present

## 2019-03-13 DIAGNOSIS — Z79899 Other long term (current) drug therapy: Secondary | ICD-10-CM | POA: Diagnosis not present

## 2019-03-13 DIAGNOSIS — I10 Essential (primary) hypertension: Secondary | ICD-10-CM | POA: Diagnosis not present

## 2019-03-28 DIAGNOSIS — L814 Other melanin hyperpigmentation: Secondary | ICD-10-CM | POA: Diagnosis not present

## 2019-03-28 DIAGNOSIS — D225 Melanocytic nevi of trunk: Secondary | ICD-10-CM | POA: Diagnosis not present

## 2019-03-28 DIAGNOSIS — L218 Other seborrheic dermatitis: Secondary | ICD-10-CM | POA: Diagnosis not present

## 2019-03-28 DIAGNOSIS — L718 Other rosacea: Secondary | ICD-10-CM | POA: Diagnosis not present

## 2019-05-18 DIAGNOSIS — M5416 Radiculopathy, lumbar region: Secondary | ICD-10-CM | POA: Diagnosis not present

## 2019-05-18 DIAGNOSIS — M545 Low back pain: Secondary | ICD-10-CM | POA: Diagnosis not present

## 2019-06-20 DIAGNOSIS — D123 Benign neoplasm of transverse colon: Secondary | ICD-10-CM | POA: Diagnosis not present

## 2019-06-20 DIAGNOSIS — Z1211 Encounter for screening for malignant neoplasm of colon: Secondary | ICD-10-CM | POA: Diagnosis not present

## 2019-06-20 DIAGNOSIS — K648 Other hemorrhoids: Secondary | ICD-10-CM | POA: Diagnosis not present

## 2019-06-27 DIAGNOSIS — I10 Essential (primary) hypertension: Secondary | ICD-10-CM | POA: Diagnosis not present

## 2023-12-20 ENCOUNTER — Other Ambulatory Visit (HOSPITAL_BASED_OUTPATIENT_CLINIC_OR_DEPARTMENT_OTHER): Payer: Self-pay

## 2024-02-09 ENCOUNTER — Other Ambulatory Visit (HOSPITAL_BASED_OUTPATIENT_CLINIC_OR_DEPARTMENT_OTHER): Payer: Self-pay

## 2024-02-09 MED ORDER — TADALAFIL 5 MG PO TABS
5.0000 mg | ORAL_TABLET | Freq: Every day | ORAL | 1 refills | Status: DC | PRN
  Filled 2024-02-09 (×4): qty 30, 7d supply, fill #0

## 2024-03-01 ENCOUNTER — Other Ambulatory Visit (HOSPITAL_BASED_OUTPATIENT_CLINIC_OR_DEPARTMENT_OTHER): Payer: Self-pay

## 2024-03-01 MED ORDER — XTAMPZA ER 27 MG PO C12A
27.0000 mg | EXTENDED_RELEASE_CAPSULE | Freq: Two times a day (BID) | ORAL | 0 refills | Status: DC
  Filled 2024-05-08: qty 60, 30d supply, fill #0

## 2024-03-01 MED ORDER — OXYCODONE HCL 10 MG PO TABS
10.0000 mg | ORAL_TABLET | Freq: Every day | ORAL | 0 refills | Status: AC | PRN
  Filled 2024-04-24: qty 30, 30d supply, fill #0

## 2024-03-01 MED ORDER — OXYCODONE HCL 10 MG PO TABS
10.0000 mg | ORAL_TABLET | Freq: Every day | ORAL | 0 refills | Status: AC | PRN
  Filled 2024-03-25: qty 30, 30d supply, fill #0

## 2024-03-01 MED ORDER — XTAMPZA ER 27 MG PO C12A
27.0000 mg | EXTENDED_RELEASE_CAPSULE | Freq: Two times a day (BID) | ORAL | 0 refills | Status: AC
  Filled 2024-03-07: qty 60, 30d supply, fill #0

## 2024-03-01 MED ORDER — XTAMPZA ER 27 MG PO C12A
27.0000 mg | EXTENDED_RELEASE_CAPSULE | Freq: Two times a day (BID) | ORAL | 0 refills | Status: AC
  Filled 2024-04-05: qty 60, 30d supply, fill #0

## 2024-03-01 MED ORDER — OXYCODONE HCL 10 MG PO TABS
10.0000 mg | ORAL_TABLET | Freq: Every day | ORAL | 0 refills | Status: AC | PRN
  Filled 2024-05-23: qty 30, 30d supply, fill #0

## 2024-03-02 ENCOUNTER — Other Ambulatory Visit (HOSPITAL_BASED_OUTPATIENT_CLINIC_OR_DEPARTMENT_OTHER): Payer: Self-pay

## 2024-03-06 ENCOUNTER — Other Ambulatory Visit (HOSPITAL_BASED_OUTPATIENT_CLINIC_OR_DEPARTMENT_OTHER): Payer: Self-pay

## 2024-03-07 ENCOUNTER — Other Ambulatory Visit: Payer: Self-pay

## 2024-03-07 ENCOUNTER — Other Ambulatory Visit (HOSPITAL_BASED_OUTPATIENT_CLINIC_OR_DEPARTMENT_OTHER): Payer: Self-pay

## 2024-03-20 ENCOUNTER — Other Ambulatory Visit (HOSPITAL_BASED_OUTPATIENT_CLINIC_OR_DEPARTMENT_OTHER): Payer: Self-pay

## 2024-03-20 MED ORDER — OMEPRAZOLE 40 MG PO CPDR
40.0000 mg | DELAYED_RELEASE_CAPSULE | Freq: Every day | ORAL | 1 refills | Status: DC
  Filled 2024-03-20 (×2): qty 90, 90d supply, fill #0

## 2024-03-20 MED ORDER — ONDANSETRON HCL 4 MG PO TABS
4.0000 mg | ORAL_TABLET | Freq: Three times a day (TID) | ORAL | 0 refills | Status: DC | PRN
  Filled 2024-03-20: qty 20, 20d supply, fill #0
  Filled 2024-03-20: qty 20, 7d supply, fill #0

## 2024-03-20 MED ORDER — BUSPIRONE HCL 5 MG PO TABS
5.0000 mg | ORAL_TABLET | Freq: Three times a day (TID) | ORAL | 0 refills | Status: DC | PRN
  Filled 2024-03-20 (×2): qty 90, 30d supply, fill #0

## 2024-03-21 ENCOUNTER — Other Ambulatory Visit (HOSPITAL_BASED_OUTPATIENT_CLINIC_OR_DEPARTMENT_OTHER): Payer: Self-pay

## 2024-03-21 MED ORDER — ONDANSETRON 4 MG PO TBDP
4.0000 mg | ORAL_TABLET | Freq: Three times a day (TID) | ORAL | 0 refills | Status: DC | PRN
  Filled 2024-03-21: qty 30, 10d supply, fill #0

## 2024-03-23 ENCOUNTER — Other Ambulatory Visit (HOSPITAL_BASED_OUTPATIENT_CLINIC_OR_DEPARTMENT_OTHER): Payer: Self-pay

## 2024-03-23 ENCOUNTER — Other Ambulatory Visit (HOSPITAL_COMMUNITY): Payer: Self-pay

## 2024-03-25 ENCOUNTER — Other Ambulatory Visit (HOSPITAL_COMMUNITY): Payer: Self-pay

## 2024-04-05 ENCOUNTER — Other Ambulatory Visit: Payer: Self-pay

## 2024-04-05 ENCOUNTER — Other Ambulatory Visit (HOSPITAL_BASED_OUTPATIENT_CLINIC_OR_DEPARTMENT_OTHER): Payer: Self-pay

## 2024-04-20 ENCOUNTER — Other Ambulatory Visit (HOSPITAL_BASED_OUTPATIENT_CLINIC_OR_DEPARTMENT_OTHER): Payer: Self-pay

## 2024-04-20 MED ORDER — LISINOPRIL 40 MG PO TABS
40.0000 mg | ORAL_TABLET | Freq: Every day | ORAL | 0 refills | Status: DC
  Filled 2024-04-20: qty 90, 90d supply, fill #0

## 2024-04-24 ENCOUNTER — Other Ambulatory Visit (HOSPITAL_BASED_OUTPATIENT_CLINIC_OR_DEPARTMENT_OTHER): Payer: Self-pay

## 2024-04-25 ENCOUNTER — Other Ambulatory Visit (HOSPITAL_BASED_OUTPATIENT_CLINIC_OR_DEPARTMENT_OTHER): Payer: Self-pay

## 2024-04-25 MED ORDER — METRONIDAZOLE 0.75 % EX CREA
TOPICAL_CREAM | Freq: Two times a day (BID) | CUTANEOUS | 3 refills | Status: AC
  Filled 2024-04-25: qty 45, 30d supply, fill #0

## 2024-04-26 ENCOUNTER — Other Ambulatory Visit (HOSPITAL_BASED_OUTPATIENT_CLINIC_OR_DEPARTMENT_OTHER): Payer: Self-pay

## 2024-04-26 MED ORDER — MINOCYCLINE HCL 100 MG PO CAPS
100.0000 mg | ORAL_CAPSULE | Freq: Two times a day (BID) | ORAL | 1 refills | Status: AC | PRN
  Filled 2024-04-26: qty 60, 30d supply, fill #0

## 2024-05-08 ENCOUNTER — Other Ambulatory Visit: Payer: Self-pay

## 2024-05-08 ENCOUNTER — Other Ambulatory Visit (HOSPITAL_BASED_OUTPATIENT_CLINIC_OR_DEPARTMENT_OTHER): Payer: Self-pay

## 2024-05-08 MED ORDER — AMLODIPINE BESYLATE 5 MG PO TABS
5.0000 mg | ORAL_TABLET | Freq: Every day | ORAL | 1 refills | Status: AC
  Filled 2024-05-08: qty 90, 90d supply, fill #0
  Filled 2024-08-02: qty 90, 90d supply, fill #1

## 2024-05-22 ENCOUNTER — Other Ambulatory Visit (HOSPITAL_BASED_OUTPATIENT_CLINIC_OR_DEPARTMENT_OTHER): Payer: Self-pay

## 2024-05-23 ENCOUNTER — Other Ambulatory Visit: Payer: Self-pay

## 2024-05-23 ENCOUNTER — Other Ambulatory Visit (HOSPITAL_BASED_OUTPATIENT_CLINIC_OR_DEPARTMENT_OTHER): Payer: Self-pay

## 2024-05-23 MED ORDER — ALFUZOSIN HCL ER 10 MG PO TB24
10.0000 mg | ORAL_TABLET | Freq: Every day | ORAL | 3 refills | Status: AC
  Filled 2024-05-23: qty 90, 90d supply, fill #0

## 2024-05-23 MED ORDER — ONDANSETRON 4 MG PO TBDP
4.0000 mg | ORAL_TABLET | Freq: Three times a day (TID) | ORAL | 0 refills | Status: AC | PRN
  Filled 2024-05-23: qty 90, 30d supply, fill #0

## 2024-05-23 MED ORDER — SULFAMETHOXAZOLE-TRIMETHOPRIM 800-160 MG PO TABS: 1.0000 | ORAL_TABLET | Freq: Two times a day (BID) | ORAL | 0 refills | Status: AC

## 2024-05-23 MED ORDER — TADALAFIL 5 MG PO TABS
5.0000 mg | ORAL_TABLET | Freq: Every day | ORAL | 5 refills | Status: AC | PRN
  Filled 2024-05-23: qty 90, 22d supply, fill #0

## 2024-05-26 ENCOUNTER — Other Ambulatory Visit (HOSPITAL_BASED_OUTPATIENT_CLINIC_OR_DEPARTMENT_OTHER): Payer: Self-pay

## 2024-05-26 MED ORDER — ROSUVASTATIN CALCIUM 5 MG PO TABS
5.0000 mg | ORAL_TABLET | Freq: Every day | ORAL | 1 refills | Status: AC
  Filled 2024-05-26: qty 90, 90d supply, fill #0

## 2024-05-30 ENCOUNTER — Other Ambulatory Visit (HOSPITAL_BASED_OUTPATIENT_CLINIC_OR_DEPARTMENT_OTHER): Payer: Self-pay

## 2024-05-30 MED ORDER — XTAMPZA ER 27 MG PO C12A
27.0000 mg | EXTENDED_RELEASE_CAPSULE | Freq: Two times a day (BID) | ORAL | 0 refills | Status: AC
  Filled 2024-06-06: qty 60, 30d supply, fill #0

## 2024-05-30 MED ORDER — OXYCODONE HCL 10 MG PO TABS
10.0000 mg | ORAL_TABLET | Freq: Every day | ORAL | 0 refills | Status: AC
  Filled 2024-06-21: qty 30, 30d supply, fill #0

## 2024-05-30 MED ORDER — OXYCODONE HCL 10 MG PO TABS: 10.0000 mg | ORAL_TABLET | Freq: Every day | ORAL | 0 refills | Status: AC | PRN

## 2024-05-30 MED ORDER — OXYCODONE HCL 10 MG PO TABS
10.0000 mg | ORAL_TABLET | Freq: Every day | ORAL | 0 refills | Status: AC | PRN
  Filled 2024-07-20: qty 30, 30d supply, fill #0

## 2024-05-30 MED ORDER — XTAMPZA ER 27 MG PO C12A
27.0000 mg | EXTENDED_RELEASE_CAPSULE | Freq: Two times a day (BID) | ORAL | 0 refills | Status: AC
  Filled 2024-07-04: qty 60, 30d supply, fill #0

## 2024-05-30 MED ORDER — XTAMPZA ER 27 MG PO C12A
27.0000 mg | EXTENDED_RELEASE_CAPSULE | Freq: Two times a day (BID) | ORAL | 0 refills | Status: AC
  Filled 2024-08-02: qty 60, 30d supply, fill #0

## 2024-05-30 MED ORDER — TIZANIDINE HCL 4 MG PO TABS
4.0000 mg | ORAL_TABLET | Freq: Three times a day (TID) | ORAL | 3 refills | Status: AC | PRN
  Filled 2024-05-30: qty 270, 90d supply, fill #0

## 2024-06-06 ENCOUNTER — Other Ambulatory Visit (HOSPITAL_BASED_OUTPATIENT_CLINIC_OR_DEPARTMENT_OTHER): Payer: Self-pay

## 2024-06-06 ENCOUNTER — Other Ambulatory Visit: Payer: Self-pay

## 2024-06-08 ENCOUNTER — Other Ambulatory Visit (HOSPITAL_BASED_OUTPATIENT_CLINIC_OR_DEPARTMENT_OTHER): Payer: Self-pay

## 2024-06-08 MED FILL — Omeprazole Cap Delayed Release 40 MG: 40.0000 mg | ORAL | 90 days supply | Qty: 90 | Fill #0 | Status: AC

## 2024-06-13 ENCOUNTER — Other Ambulatory Visit (HOSPITAL_BASED_OUTPATIENT_CLINIC_OR_DEPARTMENT_OTHER): Payer: Self-pay

## 2024-06-13 MED ORDER — ONDANSETRON HCL 8 MG PO TABS
8.0000 mg | ORAL_TABLET | Freq: Three times a day (TID) | ORAL | 3 refills | Status: AC | PRN
  Filled 2024-06-13: qty 90, 30d supply, fill #0

## 2024-06-19 ENCOUNTER — Other Ambulatory Visit (HOSPITAL_BASED_OUTPATIENT_CLINIC_OR_DEPARTMENT_OTHER): Payer: Self-pay

## 2024-06-21 ENCOUNTER — Other Ambulatory Visit (HOSPITAL_BASED_OUTPATIENT_CLINIC_OR_DEPARTMENT_OTHER): Payer: Self-pay

## 2024-07-03 ENCOUNTER — Other Ambulatory Visit (HOSPITAL_BASED_OUTPATIENT_CLINIC_OR_DEPARTMENT_OTHER): Payer: Self-pay

## 2024-07-04 ENCOUNTER — Other Ambulatory Visit (HOSPITAL_BASED_OUTPATIENT_CLINIC_OR_DEPARTMENT_OTHER): Payer: Self-pay

## 2024-07-20 ENCOUNTER — Other Ambulatory Visit (HOSPITAL_BASED_OUTPATIENT_CLINIC_OR_DEPARTMENT_OTHER): Payer: Self-pay

## 2024-07-20 MED ORDER — VITAMIN D (ERGOCALCIFEROL) 1.25 MG (50000 UNIT) PO CAPS
50000.0000 [IU] | ORAL_CAPSULE | ORAL | 0 refills | Status: AC
  Filled 2024-07-20: qty 8, 56d supply, fill #0

## 2024-07-26 ENCOUNTER — Other Ambulatory Visit (HOSPITAL_BASED_OUTPATIENT_CLINIC_OR_DEPARTMENT_OTHER): Payer: Self-pay

## 2024-07-26 ENCOUNTER — Other Ambulatory Visit: Payer: Self-pay

## 2024-07-26 MED ORDER — LISINOPRIL 40 MG PO TABS
40.0000 mg | ORAL_TABLET | Freq: Every day | ORAL | 0 refills | Status: AC
  Filled 2024-07-26: qty 90, 90d supply, fill #0

## 2024-07-28 ENCOUNTER — Other Ambulatory Visit (HOSPITAL_BASED_OUTPATIENT_CLINIC_OR_DEPARTMENT_OTHER): Payer: Self-pay

## 2024-07-28 MED ORDER — GABAPENTIN 300 MG PO CAPS
300.0000 mg | ORAL_CAPSULE | Freq: Every evening | ORAL | 2 refills | Status: AC
  Filled 2024-07-28: qty 30, 30d supply, fill #0

## 2024-08-02 ENCOUNTER — Other Ambulatory Visit (HOSPITAL_BASED_OUTPATIENT_CLINIC_OR_DEPARTMENT_OTHER): Payer: Self-pay

## 2024-08-02 ENCOUNTER — Other Ambulatory Visit: Payer: Self-pay
